# Patient Record
Sex: Male | Born: 1990 | Race: White | Hispanic: No | Marital: Single | Smoking: Current every day smoker
Health system: Southern US, Community
[De-identification: ages and names within clinical notes are randomized; demographics above are authoritative.]

## PROBLEM LIST (undated history)

## (undated) DIAGNOSIS — K219 Gastro-esophageal reflux disease without esophagitis: Secondary | ICD-10-CM

## (undated) DIAGNOSIS — J45909 Unspecified asthma, uncomplicated: Secondary | ICD-10-CM

## (undated) DIAGNOSIS — J189 Pneumonia, unspecified organism: Secondary | ICD-10-CM

## (undated) DIAGNOSIS — K3580 Unspecified acute appendicitis: Secondary | ICD-10-CM

## (undated) HISTORY — DX: Unspecified acute appendicitis: K35.80

## (undated) HISTORY — PX: OTHER SURGICAL HISTORY: SHX169

## (undated) HISTORY — PX: MANDIBLE FRACTURE SURGERY: SHX706

## (undated) HISTORY — PX: SHOULDER SURGERY: SHX246

## (undated) HISTORY — PX: APPENDECTOMY: SHX54

---
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
## 2011-06-22 LAB — CBC
HCT: 44.9
Hemoglobin: 15.5
MCHC: 34.5
MCV: 88.7
Platelets: 282
RBC: 5.06
RDW: 12.7
WBC: 13.4

## 2011-06-22 LAB — DIFFERENTIAL
Basophils Absolute: 0.1
Basophils Relative: 0
Eosinophils Absolute: 0.2
Eosinophils Relative: 1
Lymphocytes Relative: 18 — ABNORMAL LOW
Lymphs Abs: 2.4
Monocytes Absolute: 0.7
Monocytes Relative: 5
Neutro Abs: 10 — ABNORMAL HIGH
Neutrophils Relative %: 75 — ABNORMAL HIGH

## 2011-10-18 ENCOUNTER — Ambulatory Visit: Payer: Self-pay

## 2012-01-07 ENCOUNTER — Other Ambulatory Visit: Payer: Self-pay | Admitting: Internal Medicine

## 2012-01-07 DIAGNOSIS — R1013 Epigastric pain: Secondary | ICD-10-CM

## 2012-01-07 DIAGNOSIS — R140 Abdominal distension (gaseous): Secondary | ICD-10-CM

## 2012-01-08 ENCOUNTER — Ambulatory Visit
Admission: RE | Admit: 2012-01-08 | Discharge: 2012-01-08 | Disposition: A | Discharge status: Home / Self Care | Payer: BC Managed Care – PPO | Source: Ambulatory Visit | Attending: Internal Medicine | Admitting: Internal Medicine

## 2012-01-08 DIAGNOSIS — R140 Abdominal distension (gaseous): Secondary | ICD-10-CM

## 2012-01-08 DIAGNOSIS — R1013 Epigastric pain: Secondary | ICD-10-CM

## 2012-03-08 ENCOUNTER — Emergency Department (HOSPITAL_COMMUNITY)
Admission: EM | Admit: 2012-03-08 | Discharge: 2012-03-08 | Disposition: A | Discharge status: Home / Self Care | Payer: No Typology Code available for payment source | Attending: Emergency Medicine | Admitting: Emergency Medicine

## 2012-03-08 ENCOUNTER — Encounter (HOSPITAL_COMMUNITY): Payer: Self-pay | Admitting: *Deleted

## 2012-03-08 ENCOUNTER — Emergency Department (HOSPITAL_COMMUNITY): Payer: No Typology Code available for payment source

## 2012-03-08 DIAGNOSIS — R1032 Left lower quadrant pain: Secondary | ICD-10-CM | POA: Insufficient documentation

## 2012-03-08 DIAGNOSIS — S73109A Unspecified sprain of unspecified hip, initial encounter: Secondary | ICD-10-CM

## 2012-03-08 DIAGNOSIS — R000 Tachycardia, unspecified: Secondary | ICD-10-CM | POA: Insufficient documentation

## 2012-03-08 DIAGNOSIS — IMO0002 Reserved for concepts with insufficient information to code with codable children: Secondary | ICD-10-CM | POA: Insufficient documentation

## 2012-03-08 HISTORY — DX: Unspecified asthma, uncomplicated: J45.909

## 2012-03-08 MED ORDER — ONDANSETRON HCL 4 MG/2ML IJ SOLN
4.0000 mg | Freq: Once | INTRAMUSCULAR | Status: AC
  Administered 2012-03-08: 4 mg via INTRAVENOUS
  Filled 2012-03-08: qty 2

## 2012-03-08 MED ORDER — MORPHINE SULFATE 4 MG/ML IJ SOLN
4.0000 mg | Freq: Once | INTRAMUSCULAR | Status: AC
  Administered 2012-03-08: 4 mg via INTRAVENOUS
  Filled 2012-03-08: qty 1

## 2012-03-08 MED ORDER — IBUPROFEN 800 MG PO TABS: 800.0000 mg | ORAL_TABLET | Freq: Three times a day (TID) | ORAL | Status: AC

## 2012-03-08 MED ORDER — IOHEXOL 300 MG/ML  SOLN
100.0000 mL | Freq: Once | INTRAMUSCULAR | Status: AC | PRN
  Administered 2012-03-08: 100 mL via INTRAVENOUS

## 2012-03-08 MED ORDER — TETANUS-DIPHTH-ACELL PERTUSSIS 5-2.5-18.5 LF-MCG/0.5 IM SUSP
0.5000 mL | Freq: Once | INTRAMUSCULAR | Status: AC
  Administered 2012-03-08: 0.5 mL via INTRAMUSCULAR
  Filled 2012-03-08: qty 0.5

## 2012-03-08 MED ORDER — OXYCODONE-ACETAMINOPHEN 5-325 MG PO TABS: ORAL_TABLET | ORAL | Status: AC

## 2012-03-08 MED ORDER — SODIUM CHLORIDE 0.9 % IV SOLN
Freq: Once | INTRAVENOUS | Status: AC
  Administered 2012-03-08: 07:00:00 via INTRAVENOUS
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
## 2012-03-08 NOTE — Progress Notes (Signed)
Orthopedic Tech Progress Note Patient Details:  Jacob Huerta Jan 22, 1991 [id number]  Ortho Devices Type of Ortho Device: Crutches Ortho Device/Splint Interventions: Adjustment   Cammer, Mickie Bail 03/08/2012, 8:52 AM
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
## 2012-03-08 NOTE — ED Notes (Signed)
Patient transported to CT 
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
## 2012-03-09 NOTE — ED Provider Notes (Signed)
Medical screening examination/treatment/procedure(s) were conducted as a shared visit with non-physician practitioner(s) and myself.  I personally evaluated the patient during the encounter  Please see my separate respective documentation pertaining to this patient encounter   Vida Roller, MD 03/09/12 2206

## 2019-09-25 ENCOUNTER — Other Ambulatory Visit: Payer: Self-pay

## 2019-09-25 ENCOUNTER — Encounter: Payer: Self-pay | Admitting: Emergency Medicine

## 2019-09-25 DIAGNOSIS — J45909 Unspecified asthma, uncomplicated: Secondary | ICD-10-CM | POA: Diagnosis present

## 2019-09-25 DIAGNOSIS — Z885 Allergy status to narcotic agent status: Secondary | ICD-10-CM

## 2019-09-25 DIAGNOSIS — K3521 Acute appendicitis with generalized peritonitis, with abscess: Principal | ICD-10-CM | POA: Diagnosis present

## 2019-09-25 DIAGNOSIS — Z79899 Other long term (current) drug therapy: Secondary | ICD-10-CM

## 2019-09-25 DIAGNOSIS — Z20828 Contact with and (suspected) exposure to other viral communicable diseases: Secondary | ICD-10-CM | POA: Diagnosis present

## 2019-09-25 DIAGNOSIS — Z791 Long term (current) use of non-steroidal anti-inflammatories (NSAID): Secondary | ICD-10-CM

## 2019-09-25 DIAGNOSIS — F17200 Nicotine dependence, unspecified, uncomplicated: Secondary | ICD-10-CM | POA: Diagnosis present

## 2019-09-25 LAB — COMPREHENSIVE METABOLIC PANEL
ALT: 104 U/L — ABNORMAL HIGH (ref 0–44)
AST: 83 U/L — ABNORMAL HIGH (ref 15–41)
Albumin: 4.6 g/dL (ref 3.5–5.0)
Alkaline Phosphatase: 79 U/L (ref 38–126)
Anion gap: 11 (ref 5–15)
BUN: 9 mg/dL (ref 6–20)
CO2: 23 mmol/L (ref 22–32)
Calcium: 9.4 mg/dL (ref 8.9–10.3)
Chloride: 103 mmol/L (ref 98–111)
Creatinine, Ser: 0.76 mg/dL (ref 0.61–1.24)
GFR calc Af Amer: >60 mL/min (ref >60)
GFR calc non Af Amer: >60 mL/min (ref >60)
Glucose, Bld: 112 mg/dL — ABNORMAL HIGH (ref 70–99)
Potassium: 3.7 mmol/L (ref 3.5–5.1)
Sodium: 137 mmol/L (ref 135–145)
Total Bilirubin: 1.8 mg/dL — ABNORMAL HIGH (ref 0.3–1.2)
Total Protein: 8.3 g/dL — ABNORMAL HIGH (ref 6.5–8.1)

## 2019-09-25 LAB — URINALYSIS, COMPLETE (UACMP) WITH MICROSCOPIC
Bacteria, UA: NONE SEEN
Bilirubin Urine: NEGATIVE
Glucose, UA: NEGATIVE mg/dL
Hgb urine dipstick: NEGATIVE
Ketones, ur: NEGATIVE mg/dL
Leukocytes,Ua: NEGATIVE
Nitrite: NEGATIVE
Protein, ur: 30 mg/dL — AB
Specific Gravity, Urine: 1.023 (ref 1.005–1.030)
pH: 7 (ref 5.0–8.0)

## 2019-09-25 LAB — CBC
HCT: 46.4 % (ref 39.0–52.0)
Hemoglobin: 16.8 g/dL (ref 13.0–17.0)
MCH: 34.1 pg — ABNORMAL HIGH (ref 26.0–34.0)
MCHC: 36.2 g/dL — ABNORMAL HIGH (ref 30.0–36.0)
MCV: 94.1 fL (ref 80.0–100.0)
Platelets: 230 10*3/uL (ref 150–400)
RBC: 4.93 MIL/uL (ref 4.22–5.81)
RDW: 11.5 % (ref 11.5–15.5)
WBC: 12.6 10*3/uL — ABNORMAL HIGH (ref 4.0–10.5)
nRBC: 0 % (ref 0.0–0.2)

## 2019-09-25 LAB — LIPASE, BLOOD: Lipase: 20 U/L (ref 11–51)

## 2019-09-25 MED ORDER — SODIUM CHLORIDE 0.9% FLUSH: 3.0000 mL | Freq: Once | INTRAVENOUS | Status: DC

## 2019-09-25 NOTE — ED Notes (Signed)
Pt sitting in lobby with no distress noted; updated on wait time and vs retaken 

## 2019-09-25 NOTE — ED Triage Notes (Signed)
Right sided abdominal pain x 1 week.  Denies N/V/D.  States after eating feels bloated.  Last BM today.

## 2019-09-26 ENCOUNTER — Inpatient Hospital Stay
Admission: EM | Admit: 2019-09-26 | Discharge: 2019-09-28 | DRG: 340 | Disposition: A | Discharge status: Home / Self Care | Payer: Self-pay | Attending: Surgery | Admitting: Surgery

## 2019-09-26 ENCOUNTER — Emergency Department: Payer: Self-pay

## 2019-09-26 ENCOUNTER — Inpatient Hospital Stay: Payer: Self-pay

## 2019-09-26 DIAGNOSIS — R109 Unspecified abdominal pain: Secondary | ICD-10-CM

## 2019-09-26 DIAGNOSIS — K353 Acute appendicitis with localized peritonitis, without perforation or gangrene: Secondary | ICD-10-CM

## 2019-09-26 DIAGNOSIS — K3580 Unspecified acute appendicitis: Secondary | ICD-10-CM | POA: Diagnosis present

## 2019-09-26 LAB — BASIC METABOLIC PANEL
Anion gap: 11 (ref 5–15)
BUN: 13 mg/dL (ref 6–20)
CO2: 23 mmol/L (ref 22–32)
Calcium: 8.9 mg/dL (ref 8.9–10.3)
Chloride: 103 mmol/L (ref 98–111)
Creatinine, Ser: 0.88 mg/dL (ref 0.61–1.24)
GFR calc Af Amer: >60 mL/min (ref >60)
GFR calc non Af Amer: >60 mL/min (ref >60)
Glucose, Bld: 97 mg/dL (ref 70–99)
Potassium: 3.3 mmol/L — ABNORMAL LOW (ref 3.5–5.1)
Sodium: 137 mmol/L (ref 135–145)

## 2019-09-26 LAB — CBC WITH DIFFERENTIAL/PLATELET
Abs Immature Granulocytes: 0.05 10*3/uL (ref 0.00–0.07)
Basophils Absolute: 0.1 10*3/uL (ref 0.0–0.1)
Basophils Relative: 1 %
Eosinophils Absolute: 0.2 10*3/uL (ref 0.0–0.5)
Eosinophils Relative: 2 %
HCT: 42.1 % (ref 39.0–52.0)
Hemoglobin: 15.1 g/dL (ref 13.0–17.0)
Immature Granulocytes: 0 %
Lymphocytes Relative: 23 %
Lymphs Abs: 2.5 10*3/uL (ref 0.7–4.0)
MCH: 34.2 pg — ABNORMAL HIGH (ref 26.0–34.0)
MCHC: 35.9 g/dL (ref 30.0–36.0)
MCV: 95.2 fL (ref 80.0–100.0)
Monocytes Absolute: 0.9 10*3/uL (ref 0.1–1.0)
Monocytes Relative: 8 %
Neutro Abs: 7.4 10*3/uL (ref 1.7–7.7)
Neutrophils Relative %: 66 %
Platelets: 220 10*3/uL (ref 150–400)
RBC: 4.42 MIL/uL (ref 4.22–5.81)
RDW: 11.5 % (ref 11.5–15.5)
WBC: 11.1 10*3/uL — ABNORMAL HIGH (ref 4.0–10.5)
nRBC: 0 % (ref 0.0–0.2)

## 2019-09-26 LAB — RESPIRATORY PANEL BY RT PCR (FLU A&B, COVID)
Influenza A by PCR: NEGATIVE
Influenza B by PCR: NEGATIVE
SARS Coronavirus 2 by RT PCR: NEGATIVE

## 2019-09-26 LAB — HIV ANTIBODY (ROUTINE TESTING W REFLEX): HIV Screen 4th Generation wRfx: NONREACTIVE

## 2019-09-26 LAB — MAGNESIUM: Magnesium: 2 mg/dL (ref 1.7–2.4)

## 2019-09-26 MED ORDER — ACETAMINOPHEN 325 MG PO TABS
650.0000 mg | ORAL_TABLET | Freq: Four times a day (QID) | ORAL | Status: DC | PRN
  Administered 2019-09-26: 650 mg via ORAL
  Filled 2019-09-26: qty 2

## 2019-09-26 MED ORDER — KCL IN DEXTROSE-NACL 20-5-0.45 MEQ/L-%-% IV SOLN
INTRAVENOUS | Status: DC
  Filled 2019-09-26 (×8): qty 1000

## 2019-09-26 MED ORDER — ONDANSETRON HCL 4 MG/2ML IJ SOLN: 4.0000 mg | Freq: Four times a day (QID) | INTRAMUSCULAR | Status: DC | PRN

## 2019-09-26 MED ORDER — ONDANSETRON 4 MG PO TBDP: 4.0000 mg | ORAL_TABLET | Freq: Four times a day (QID) | ORAL | Status: DC | PRN

## 2019-09-26 MED ORDER — MORPHINE SULFATE (PF) 4 MG/ML IV SOLN
4.0000 mg | Freq: Once | INTRAVENOUS | Status: AC
  Administered 2019-09-26: 02:00:00 4 mg via INTRAVENOUS
  Filled 2019-09-26: qty 1

## 2019-09-26 MED ORDER — MORPHINE SULFATE (PF) 4 MG/ML IV SOLN
4.0000 mg | Freq: Once | INTRAVENOUS | Status: AC
  Administered 2019-09-26: 4 mg via INTRAVENOUS
  Filled 2019-09-26: qty 1

## 2019-09-26 MED ORDER — SODIUM CHLORIDE 0.9 % IV SOLN: 3.0000 g | Freq: Once | INTRAVENOUS | Status: DC

## 2019-09-26 MED ORDER — IOHEXOL 300 MG/ML  SOLN
100.0000 mL | Freq: Once | INTRAMUSCULAR | Status: AC | PRN
  Administered 2019-09-26: 100 mL via INTRAVENOUS

## 2019-09-26 MED ORDER — ACETAMINOPHEN 500 MG PO TABS: 1000.0000 mg | ORAL_TABLET | Freq: Four times a day (QID) | ORAL | Status: DC

## 2019-09-26 MED ORDER — LACTATED RINGERS IV SOLN
125.0000 mL/h | INTRAVENOUS | Status: DC
  Administered 2019-09-26 (×3): 125 mL/h via INTRAVENOUS

## 2019-09-26 MED ORDER — POLYETHYLENE GLYCOL 3350 17 G PO PACK: 17.0000 g | PACK | Freq: Every day | ORAL | Status: DC | PRN

## 2019-09-26 MED ORDER — HYDROMORPHONE HCL 1 MG/ML IJ SOLN
0.5000 mg | Freq: Once | INTRAMUSCULAR | Status: AC
  Administered 2019-09-26: 0.5 mg via INTRAVENOUS
  Filled 2019-09-26: qty 0.5

## 2019-09-26 MED ORDER — PANTOPRAZOLE SODIUM 40 MG IV SOLR
40.0000 mg | Freq: Every day | INTRAVENOUS | Status: DC
  Administered 2019-09-26 – 2019-09-27 (×2): 40 mg via INTRAVENOUS
  Filled 2019-09-26 (×2): qty 40

## 2019-09-26 MED ORDER — HYDROMORPHONE HCL 1 MG/ML IJ SOLN
0.5000 mg | INTRAMUSCULAR | Status: DC | PRN
  Administered 2019-09-26 – 2019-09-28 (×8): 0.5 mg via INTRAVENOUS
  Filled 2019-09-26 (×8): qty 0.5

## 2019-09-26 MED ORDER — SODIUM CHLORIDE 0.9 % IV SOLN
INTRAVENOUS | Status: DC | PRN
  Administered 2019-09-26: 500 mL via INTRAVENOUS

## 2019-09-26 MED ORDER — ALBUTEROL SULFATE (2.5 MG/3ML) 0.083% IN NEBU: 2.5000 mg | INHALATION_SOLUTION | Freq: Four times a day (QID) | RESPIRATORY_TRACT | Status: DC | PRN

## 2019-09-26 MED ORDER — ONDANSETRON HCL 4 MG/2ML IJ SOLN
4.0000 mg | Freq: Once | INTRAMUSCULAR | Status: AC
  Administered 2019-09-26: 4 mg via INTRAVENOUS
  Filled 2019-09-26: qty 2

## 2019-09-26 MED ORDER — KETOROLAC TROMETHAMINE 30 MG/ML IJ SOLN
30.0000 mg | Freq: Four times a day (QID) | INTRAMUSCULAR | Status: DC
  Administered 2019-09-26 (×3): 30 mg via INTRAVENOUS
  Filled 2019-09-26 (×3): qty 1

## 2019-09-26 MED ORDER — ENOXAPARIN SODIUM 40 MG/0.4ML ~~LOC~~ SOLN
40.0000 mg | SUBCUTANEOUS | Status: DC
  Administered 2019-09-26 – 2019-09-28 (×3): 40 mg via SUBCUTANEOUS
  Filled 2019-09-26 (×3): qty 0.4

## 2019-09-26 MED ORDER — PIPERACILLIN-TAZOBACTAM 3.375 G IVPB
3.3750 g | Freq: Three times a day (TID) | INTRAVENOUS | Status: DC
  Administered 2019-09-26 (×3): 3.375 g via INTRAVENOUS
  Filled 2019-09-26 (×3): qty 50

## 2019-09-26 NOTE — Progress Notes (Signed)
09/26/2019 1830  Called into patient's room because he was complaining of pain 10/10 "worse than when I came in here."  Administered scheduled toradol as ordered.  Contacted surgeon on call Lysle Pearl, who instructed to recheck vitals.  Dr. Lysle Pearl said he will be here to see the patient as soon as he can.  Vital signs showed elevated respiratory rate and blood pressure.  Patient describes slightly diminished pain, but generally feeling the same as when he first called.  Awaiting arrival of Dr. Lysle Pearl and further instructions.  Will relay this info to oncoming night shift RN  Dola Argyle, RN

## 2019-09-26 NOTE — ED Notes (Addendum)
ED TO INPATIENT HANDOFF REPORT  ED Nurse Name and Phone #:  Elijah Birkom RN   [phone]  S Name/Age/Gender Jacob Huerta 28 y.o. male Room/Bed: ED06A/ED06A  Code Status   Code Status: Full Code  Home/SNF/Other Home Patient oriented to: self, place, time and situation Is this baseline? Yes   Triage Complete: Triage complete  Chief Complaint Acute appendicitis [K35.80]  Triage Note Right sided abdominal pain x 1 week.  Denies N/V/D.  States after eating feels bloated.  Last BM today.    Allergies Allergies  Allergen Reactions  . Hydrocodone     Vomiting, itching    Level of Care/Admitting Diagnosis ED Disposition    ED Disposition Condition Comment   Admit  Hospital Area: [facility] [[id number]]  Level of Care: Med-Surg [16]  Covid Evaluation: Asymptomatic Screening Protocol (No Symptoms)  Diagnosis: Acute appendicitis [454098][[id number]]  Admitting Physician: Henrene DodgeISCOYA, JOSE [[id number]]  Attending Physician: Henrene DodgeISCOYA, JOSE [[id number]]  Estimated length of stay: past midnight tomorrow  Certification:: I certify this patient will need inpatient services for at least 2 midnights       B Medical/Surgery History Past Medical History:  Diagnosis Date  . Asthma    Past Surgical History:  Procedure Laterality Date  . OTHER SURGICAL HISTORY     jaw surgery, rotator cuff surgery     A IV Location/Drains/Wounds Patient Lines/Drains/Airways Status   Active Line/Drains/Airways    Name:   Placement date:   Placement time:   Site:   Days:   Peripheral IV 09/26/19 Left Antecubital   09/26/19    0217    Antecubital   less than 1          Intake/Output Last 24 hours No intake or output data in the 24 hours ending 09/26/19 0428  Labs/Imaging Results for orders placed or performed during the hospital encounter of 09/26/19 (from the past 48 hour(s))  Lipase, blood     Status: None   Collection Time: 09/25/19  6:11 PM  Result Value Ref Range   Lipase 20 11 - 51 U/L     Comment: Performed at [facility], [address] [place], [place] [address]  Comprehensive metabolic panel     Status: Abnormal   Collection Time: 09/25/19  6:11 PM  Result Value Ref Range   Sodium 137 135 - 145 mmol/L   Potassium 3.7 3.5 - 5.1 mmol/L   Chloride 103 98 - 111 mmol/L   CO2 23 22 - 32 mmol/L   Glucose, Bld 112 (H) 70 - 99 mg/dL   BUN 9 6 - 20 mg/dL   Creatinine, Ser 8.460.76 0.61 - 1.24 mg/dL   Calcium 9.4 8.9 - 96.210.3 mg/dL   Total Protein 8.3 (H) 6.5 - 8.1 g/dL   Albumin 4.6 3.5 - 5.0 g/dL   AST 83 (H) 15 - 41 U/L   ALT 104 (H) 0 - 44 U/L   Alkaline Phosphatase 79 38 - 126 U/L   Total Bilirubin 1.8 (H) 0.3 - 1.2 mg/dL   GFR calc non Af Amer >60 >60 mL/min   GFR calc Af Amer >60 >60 mL/min   Anion gap 11 5 - 15    Comment: Performed at [facility], [address] [place], [place] [address]  CBC     Status: Abnormal   Collection Time: 09/25/19  6:11 PM  Result Value Ref Range   WBC 12.6 (H) 4.0 - 10.5 K/uL   RBC 4.93 4.22 - 5.81 MIL/uL  Hemoglobin 16.8 13.0 - 17.0 g/dL   HCT 35.3 61.4 - 43.1 %   MCV 94.1 80.0 - 100.0 fL   MCH 34.1 (H) 26.0 - 34.0 pg   MCHC 36.2 (H) 30.0 - 36.0 g/dL   RDW 54.0 08.6 - 76.1 %   Platelets 230 150 - 400 K/uL   nRBC 0.0 0.0 - 0.2 %    Comment: Performed at [facility], [address] [place], [place] [address]  Urinalysis, Complete w Microscopic     Status: Abnormal   Collection Time: 09/25/19  6:11 PM  Result Value Ref Range   Color, Urine AMBER (A) YELLOW    Comment: BIOCHEMICALS MAY BE AFFECTED BY COLOR   APPearance CLEAR (A) CLEAR   Specific Gravity, Urine 1.023 1.005 - 1.030   pH 7.0 5.0 - 8.0   Glucose, UA NEGATIVE NEGATIVE mg/dL   Hgb urine dipstick NEGATIVE NEGATIVE   Bilirubin Urine NEGATIVE NEGATIVE   Ketones, ur NEGATIVE NEGATIVE mg/dL   Protein, ur 30 (A) NEGATIVE mg/dL   Nitrite NEGATIVE NEGATIVE   Leukocytes,Ua NEGATIVE NEGATIVE   RBC / HPF 0-5 0 - 5 RBC/hpf   WBC, UA 0-5 0  - 5 WBC/hpf   Bacteria, UA NONE SEEN NONE SEEN   Squamous Epithelial / LPF 0-5 0 - 5   Mucus PRESENT     Comment: Performed at [facility], [address] [place], [place] [address]   CT ABDOMEN PELVIS W CONTRAST  Result Date: 09/26/2019 CLINICAL DATA:  Right lower quadrant abdominal pain, appendicitis suspected. Patient reports right-sided abdominal pain for a week. EXAM: CT ABDOMEN AND PELVIS WITH CONTRAST TECHNIQUE: Multidetector CT imaging of the abdomen and pelvis was performed using the standard protocol following bolus administration of intravenous contrast. CONTRAST:  OMNIPAQUE IOHEXOL 300 MG/ML  SOLN COMPARISON:  CT 03/08/2012 FINDINGS: Lower chest: Lung bases are clear. Hepatobiliary: The liver is enlarged spanning 25.7 cm cranial caudal. Advanced hepatic steatosis with diffusely decreased hepatic density. No evidence of focal lesion. Gallbladder physiologically distended, no calcified stone. No biliary dilatation. Pancreas: No ductal dilatation or inflammation. Spleen: Enlarged spanning 16 cm cranial caudal. Lobular borders without focal abnormality. Small splenule at the hilum. Adrenals/Urinary Tract: Normal adrenal glands. No hydronephrosis or perinephric edema. Homogeneous renal enhancement. Small hypodense lesion in the central left kidney is too small to characterize but likely small cyst. Urinary bladder is physiologically distended without wall thickening. Stomach/Bowel: Acute appendicitis as described below. The stomach is nondistended. No small bowel dilatation or inflammation. Colonic diverticulosis is most prominent in the ascending and proximal colon. No evidence of acute diverticulitis. Appendix: Location: Retrocecal Diameter: 12 mm Appendicolith: No Mucosal hyper-enhancement: Minimal Extraluminal gas: No Periappendiceal collection: Significant periappendiceal fat stranding and small amount of free fluid but no organized fluid collection or abscess.  Vascular/Lymphatic: Prominent ileocolic nodes are likely reactive. Abdominal aorta is normal in caliber. The portal vein is patent. No portal venous or mesenteric gas. Reproductive: Prostate is unremarkable. Other: Right lower quadrant inflammation related to acute appendicitis. Small amount of free fluid with minimal fluid tracking into the right pericolic gutter. No organized fluid collection. No free air. Musculoskeletal: There are no acute or suspicious osseous abnormalities. Mild degenerative disc disease in the lower lumbar spine. IMPRESSION: 1. Uncomplicated acute appendicitis. 2. Hepatosplenomegaly and advanced hepatic steatosis. 3. Colonic diverticulosis primarily involving the ascending colon. No diverticulitis peer Electronically Signed   By: Narda Rutherford M.D.   On: 09/26/2019 03:22    Pending Labs  Unresulted Labs (From admission, onward)    Start     Ordered   09/26/19 7824  Basic metabolic panel  Tomorrow morning,   STAT     09/26/19 0349   09/26/19 0500  Magnesium  Tomorrow morning,   STAT     09/26/19 0349   09/26/19 0500  CBC WITH DIFFERENTIAL  Tomorrow morning,   STAT     09/26/19 0349   09/26/19 0347  HIV Antibody (routine testing w rflx)  (HIV Antibody (Routine testing w reflex) panel)  Once,   STAT     09/26/19 0349   09/26/19 0345  Respiratory Panel by RT PCR (Flu A&B, Covid) - Nasopharyngeal Swab  (Tier 2 Respiratory Panel by RT PCR (Flu A&B, Covid) (TAT 2 hrs))  Once,   STAT    Question Answer Comment  Is this test for diagnosis or screening Screening   Symptomatic for COVID-19 as defined by CDC No   Hospitalized for COVID-19 No   Admitted to ICU for COVID-19 No   Previously tested for COVID-19 No   Resident in a congregate (group) care setting No   Employed in healthcare setting No      09/26/19 0344          Vitals/Pain Today's Vitals   09/26/19 0300 09/26/19 0316 09/26/19 0330 09/26/19 0400  BP: (!) 169/96  (!) 132/51 (!) 143/81  Pulse: 100  92 (!) 102   Resp: 16  (!) 21 20  Temp:      TempSrc:      SpO2: 98%  94% 96%  Weight:      Height:      PainSc:  3       Isolation Precautions No active isolations  Medications Medications  lactated ringers infusion (125 mL/hr Intravenous New Bag/Given 09/26/19 0422)  acetaminophen (TYLENOL) tablet 1,000 mg (has no administration in time range)  ketorolac (TORADOL) 30 MG/ML injection 30 mg (has no administration in time range)  HYDROmorphone (DILAUDID) injection 0.5 mg (has no administration in time range)  polyethylene glycol (MIRALAX / GLYCOLAX) packet 17 g (has no administration in time range)  ondansetron (ZOFRAN-ODT) disintegrating tablet 4 mg (has no administration in time range)    Or  ondansetron (ZOFRAN) injection 4 mg (has no administration in time range)  pantoprazole (PROTONIX) injection 40 mg (has no administration in time range)  enoxaparin (LOVENOX) injection 40 mg (has no administration in time range)  piperacillin-tazobactam (ZOSYN) IVPB 3.375 g (has no administration in time range)  albuterol (PROVENTIL) (2.5 MG/3ML) 0.083% nebulizer solution 2.5 mg (has no administration in time range)  morphine 4 MG/ML injection 4 mg (4 mg Intravenous Given 09/26/19 0218)  ondansetron (ZOFRAN) injection 4 mg (4 mg Intravenous Given 09/26/19 0218)  iohexol (OMNIPAQUE) 300 MG/ML solution 100 mL (100 mLs Intravenous Contrast Given 09/26/19 0246)  morphine 4 MG/ML injection 4 mg (4 mg Intravenous Given 09/26/19 0354)    Mobility walks Low fall risk   Focused Assessments Cardiac Assessment Handoff:    No results found for: CKTOTAL, CKMB, CKMBINDEX, TROPONINI No results found for: DDIMER Does the Patient currently have chest pain? No      R Recommendations: See Admitting Provider Note  Report given to: Beth RN on 2C  Additional Notes:

## 2019-09-26 NOTE — Progress Notes (Addendum)
Subjective:  CC: Jacob Huerta is a 28 y.o. male  Hospital stay day 0,   acute appendicitis  HPI: Called by RN re: increased pain. RLQ toward right flank.  Sudden onset, no instigating factor, minimal change after administration of toradol.  Last dose of dilaudid was a few hours prior to pain onset.  Presentation similar to pain on admission.  No nausea/vomiting.    ROS:  General: Denies weight loss, weight gain, fatigue, fevers, chills, and night sweats. Heart: Denies chest pain, palpitations, racing heart, irregular heartbeat, leg pain or swelling, and decreased activity tolerance. Respiratory: Denies breathing difficulty, shortness of breath, wheezing, cough, and sputum. GI: Denies change in appetite, heartburn, nausea, vomiting, constipation, diarrhea, and blood in stool. GU: Denies difficulty urinating, pain with urinating, urgency, frequency, blood in urine.   Objective:   Temp:  [98.2 F (36.8 C)-102.9 F (39.4 C)] 102.9 F (39.4 C) (12/29 2100) Pulse Rate:  [87-105] 104 (12/29 2100) Resp:  [16-26] 20 (12/29 2100) BP: (132-189)/(51-102) 145/96 (12/29 2100) SpO2:  [93 %-100 %] 99 % (12/29 2100)     Height: 5\' 8"  (172.7 cm) Weight: 106.6 kg BMI (Calculated): 35.74   Intake/Output this shift:   Intake/Output Summary (Last 24 hours) at 09/26/2019 2101 Last data filed at 09/26/2019 1700 Gross per 24 hour  Intake 1776.9 ml  Output 351 ml  Net 1425.9 ml    Constitutional :  alert, cooperative, appears stated age and no distress  Respiratory:  clear to auscultation bilaterally  Cardiovascular:  regular rate and rhythm  Gastrointestinal: soft, no guarding, but TTP in RLQ and right flank as reported. remaining quadrants with no pain..   Skin: Cool and moist.   Psychiatric: Normal affect, non-agitated, not confused       LABS:  CMP Latest Ref Rng & Units 09/26/2019 09/25/2019  Glucose 70 - 99 mg/dL 97 112(H)  BUN 6 - 20 mg/dL 13 9  Creatinine 0.61 - 1.24 mg/dL 0.88 0.76   Sodium 135 - 145 mmol/L 137 137  Potassium 3.5 - 5.1 mmol/L 3.3(L) 3.7  Chloride 98 - 111 mmol/L 103 103  CO2 22 - 32 mmol/L 23 23  Calcium 8.9 - 10.3 mg/dL 8.9 9.4  Total Protein 6.5 - 8.1 g/dL - 8.3(H)  Total Bilirubin 0.3 - 1.2 mg/dL - 1.8(H)  Alkaline Phos 38 - 126 U/L - 79  AST 15 - 41 U/L - 83(H)  ALT 0 - 44 U/L - 104(H)   CBC Latest Ref Rng & Units 09/26/2019 09/25/2019 11/29/2007  WBC 4.0 - 10.5 K/uL 11.1(H) 12.6(H) 13.4  Hemoglobin 13.0 - 17.0 g/dL 15.1 16.8 15.5  Hematocrit 39.0 - 52.0 % 42.1 46.4 44.9  Platelets 150 - 400 K/uL 220 230 282    RADS: CLINICAL DATA:  Appendicitis.  EXAM: ABDOMEN - 1 VIEW  COMPARISON:  September 26, 2019  FINDINGS: The bowel gas pattern is nonspecific and nonobstructive. There are no radiopaque kidney stones. There is no obvious acute osseous abnormality. No pneumatosis or free air.  IMPRESSION: Nonobstructive bowel gas pattern. No obvious pneumatosis or free air.   Electronically Signed   By: Constance Holster M.D.   On: 09/26/2019 20:44  Assessment:   Appendicitis, on iv abx.  NPO.  Wbc decreased this am and doing well throughout the day until late evening with sudden worsening of pain.  Initial exam with no obvious peritonitis.  Asked to give another dose of dilaudid after exam and KUB ordered with resulted noted above.  Re-exam  after dilaudid administration notes decreased pain and less TTP on exam.  However, he did develop fever of 102.9 on repeat vitals.  Discussed with patient how appendicitis usually improves with IV abx, overall clinical picture concerning for possible failure of abx management.  I offered him to proceed with operative intervention at this time, explaining to him he is still at high risk for possible conversion to open, possible ileocecotmy.  Pt stated he would prefer to wait a few more hours to see if any recurrence of pain after dilaudid starts wearing off.  Since no sign of obvious peritonitis on  exam and negative upright KUB for free air, I stated we can continue with serial abdominal exams for now.  Low threshold for OR.

## 2019-09-26 NOTE — H&P (Signed)
Date of Admission:  09/26/2019  Reason for Admission:  Acute appendicitis  History of Present Illness: Jacob Huerta is a 28 y.o. male presenting with a 1 week history of right lower quadrant abdominal pain.  He had a back sprain about that time as well and he thought the right sided pain was related to the back.  However, the pain persisted and got significantly worse on 12/27 and 12/28 and he presented yesterday to the ED.  He denies any fevers or chills, chest pain, shortness of breath, nausea, or vomiting.  His workup in the ED shows a WBC of 12.6.  CT scan was obtained showing acute appendicitis, with a retrocecal appendix, and significant periappendiceal inflammation and some mild free fluid.  Past Medical History: Past Medical History:  Diagnosis Date  . Asthma      Past Surgical History: Past Surgical History:  Procedure Laterality Date  . OTHER SURGICAL HISTORY     jaw surgery, rotator cuff surgery    Home Medications: Prior to Admission medications   Medication Sig Start Date End Date Taking? Authorizing Provider  albuterol (PROVENTIL HFA;VENTOLIN HFA) 108 (90 BASE) MCG/ACT inhaler Inhale 2 puffs into the lungs every 6 (six) hours as needed. For breathing   Yes [provider]  ibuprofen (ADVIL) 200 MG tablet Take 200 mg by mouth every 6 (six) hours as needed for fever, headache or moderate pain.   Yes [provider]    Allergies: Allergies  Allergen Reactions  . Hydrocodone     Vomiting, itching    Social History:  reports that he has been smoking. He has never used smokeless tobacco. He reports current alcohol use. He reports that he does not use drugs.   Family History: No family history on file.  Review of Systems: Review of Systems  Constitutional: Negative for chills and fever.  HENT: Negative for hearing loss.   Respiratory: Negative for shortness of breath.   Cardiovascular: Negative for chest pain.  Gastrointestinal: Positive  for abdominal pain. Negative for constipation, diarrhea, nausea and vomiting.  Genitourinary: Negative for dysuria.  Musculoskeletal: Positive for joint pain. Negative for myalgias.  Skin: Negative for rash.  Neurological: Negative for dizziness.  Psychiatric/Behavioral: Negative for depression.    Physical Exam BP (!) 138/94 (BP Location: Right Arm)   Pulse 92   Temp 98.2 F (36.8 C) (Oral)   Resp 16   Ht 5\' 8"  (1.727 m)   Wt 106.6 kg   SpO2 97%   BMI 35.73 kg/m  CONSTITUTIONAL: No acute distress HEENT:  Normocephalic, atraumatic, extraocular motion intact. NECK: Trachea is midline, and there is no jugular venous distension.  RESPIRATORY:  Lungs are clear, and breath sounds are equal bilaterally. Normal respiratory effort without pathologic use of accessory muscles. CARDIOVASCULAR: Heart is regular without murmurs, gallops, or rubs. GI: The abdomen is soft, obese, non-distended, with tenderness to palpation in the right lower quadrant and flank area.  Non-peritoneal.  MUSCULOSKELETAL:  Normal muscle strength and tone in all four extremities.  No peripheral edema or cyanosis. SKIN: Skin turgor is normal. There are no pathologic skin lesions.  NEUROLOGIC:  Motor and sensation is grossly normal.  Cranial nerves are grossly intact. PSYCH:  Alert and oriented to person, place and time. Affect is normal.  Laboratory Analysis: Results for orders placed or performed during the hospital encounter of 09/26/19 (from the past 24 hour(s))  Lipase, blood     Status: None   Collection Time: 09/25/19  6:11 PM  Result Value Ref Range   Lipase 20 11 - 51 U/L  Comprehensive metabolic panel     Status: Abnormal   Collection Time: 09/25/19  6:11 PM  Result Value Ref Range   Sodium 137 135 - 145 mmol/L   Potassium 3.7 3.5 - 5.1 mmol/L   Chloride 103 98 - 111 mmol/L   CO2 23 22 - 32 mmol/L   Glucose, Bld 112 (H) 70 - 99 mg/dL   BUN 9 6 - 20 mg/dL   Creatinine, Ser 0.76 0.61 - 1.24 mg/dL    Calcium 9.4 8.9 - 10.3 mg/dL   Total Protein 8.3 (H) 6.5 - 8.1 g/dL   Albumin 4.6 3.5 - 5.0 g/dL   AST 83 (H) 15 - 41 U/L   ALT 104 (H) 0 - 44 U/L   Alkaline Phosphatase 79 38 - 126 U/L   Total Bilirubin 1.8 (H) 0.3 - 1.2 mg/dL   GFR calc non Af Amer >60 >60 mL/min   GFR calc Af Amer >60 >60 mL/min   Anion gap 11 5 - 15  CBC     Status: Abnormal   Collection Time: 09/25/19  6:11 PM  Result Value Ref Range   WBC 12.6 (H) 4.0 - 10.5 K/uL   RBC 4.93 4.22 - 5.81 MIL/uL   Hemoglobin 16.8 13.0 - 17.0 g/dL   HCT 46.4 39.0 - 52.0 %   MCV 94.1 80.0 - 100.0 fL   MCH 34.1 (H) 26.0 - 34.0 pg   MCHC 36.2 (H) 30.0 - 36.0 g/dL   RDW 11.5 11.5 - 15.5 %   Platelets 230 150 - 400 K/uL   nRBC 0.0 0.0 - 0.2 %  Urinalysis, Complete w Microscopic     Status: Abnormal   Collection Time: 09/25/19  6:11 PM  Result Value Ref Range   Color, Urine AMBER (A) YELLOW   APPearance CLEAR (A) CLEAR   Specific Gravity, Urine 1.023 1.005 - 1.030   pH 7.0 5.0 - 8.0   Glucose, UA NEGATIVE NEGATIVE mg/dL   Hgb urine dipstick NEGATIVE NEGATIVE   Bilirubin Urine NEGATIVE NEGATIVE   Ketones, ur NEGATIVE NEGATIVE mg/dL   Protein, ur 30 (A) NEGATIVE mg/dL   Nitrite NEGATIVE NEGATIVE   Leukocytes,Ua NEGATIVE NEGATIVE   RBC / HPF 0-5 0 - 5 RBC/hpf   WBC, UA 0-5 0 - 5 WBC/hpf   Bacteria, UA NONE SEEN NONE SEEN   Squamous Epithelial / LPF 0-5 0 - 5   Mucus PRESENT   Respiratory Panel by RT PCR (Flu A&B, Covid) - Nasopharyngeal Swab     Status: None   Collection Time: 09/26/19  4:33 AM   Specimen: Nasopharyngeal Swab  Result Value Ref Range   SARS Coronavirus 2 by RT PCR NEGATIVE NEGATIVE   Influenza A by PCR NEGATIVE NEGATIVE   Influenza B by PCR NEGATIVE NEGATIVE  Basic metabolic panel     Status: Abnormal   Collection Time: 09/26/19  6:24 AM  Result Value Ref Range   Sodium 137 135 - 145 mmol/L   Potassium 3.3 (L) 3.5 - 5.1 mmol/L   Chloride 103 98 - 111 mmol/L   CO2 23 22 - 32 mmol/L   Glucose, Bld 97 70  - 99 mg/dL   BUN 13 6 - 20 mg/dL   Creatinine, Ser 0.88 0.61 - 1.24 mg/dL   Calcium 8.9 8.9 - 10.3 mg/dL   GFR calc non Af Amer >60 >60 mL/min   GFR calc Af Amer >60 >60  mL/min   Anion gap 11 5 - 15  Magnesium     Status: None   Collection Time: 09/26/19  6:24 AM  Result Value Ref Range   Magnesium 2.0 1.7 - 2.4 mg/dL  CBC WITH DIFFERENTIAL     Status: Abnormal   Collection Time: 09/26/19  6:24 AM  Result Value Ref Range   WBC 11.1 (H) 4.0 - 10.5 K/uL   RBC 4.42 4.22 - 5.81 MIL/uL   Hemoglobin 15.1 13.0 - 17.0 g/dL   HCT 82.942.1 56.239.0 - 13.052.0 %   MCV 95.2 80.0 - 100.0 fL   MCH 34.2 (H) 26.0 - 34.0 pg   MCHC 35.9 30.0 - 36.0 g/dL   RDW 86.511.5 78.411.5 - 69.615.5 %   Platelets 220 150 - 400 K/uL   nRBC 0.0 0.0 - 0.2 %   Neutrophils Relative % 66 %   Neutro Abs 7.4 1.7 - 7.7 K/uL   Lymphocytes Relative 23 %   Lymphs Abs 2.5 0.7 - 4.0 K/uL   Monocytes Relative 8 %   Monocytes Absolute 0.9 0.1 - 1.0 K/uL   Eosinophils Relative 2 %   Eosinophils Absolute 0.2 0.0 - 0.5 K/uL   Basophils Relative 1 %   Basophils Absolute 0.1 0.0 - 0.1 K/uL   Immature Granulocytes 0 %   Abs Immature Granulocytes 0.05 0.00 - 0.07 K/uL    Imaging: CT ABDOMEN PELVIS W CONTRAST  Result Date: 09/26/2019 CLINICAL DATA:  Right lower quadrant abdominal pain, appendicitis suspected. Patient reports right-sided abdominal pain for a week. EXAM: CT ABDOMEN AND PELVIS WITH CONTRAST TECHNIQUE: Multidetector CT imaging of the abdomen and pelvis was performed using the standard protocol following bolus administration of intravenous contrast. CONTRAST:  100mL OMNIPAQUE IOHEXOL 300 MG/ML  SOLN COMPARISON:  CT 03/08/2012 FINDINGS: Lower chest: Lung bases are clear. Hepatobiliary: The liver is enlarged spanning 25.7 cm cranial caudal. Advanced hepatic steatosis with diffusely decreased hepatic density. No evidence of focal lesion. Gallbladder physiologically distended, no calcified stone. No biliary dilatation. Pancreas: No ductal  dilatation or inflammation. Spleen: Enlarged spanning 16 cm cranial caudal. Lobular borders without focal abnormality. Small splenule at the hilum. Adrenals/Urinary Tract: Normal adrenal glands. No hydronephrosis or perinephric edema. Homogeneous renal enhancement. Small hypodense lesion in the central left kidney is too small to characterize but likely small cyst. Urinary bladder is physiologically distended without wall thickening. Stomach/Bowel: Acute appendicitis as described below. The stomach is nondistended. No small bowel dilatation or inflammation. Colonic diverticulosis is most prominent in the ascending and proximal colon. No evidence of acute diverticulitis. Appendix: Location: Retrocecal Diameter: 12 mm Appendicolith: No Mucosal hyper-enhancement: Minimal Extraluminal gas: No Periappendiceal collection: Significant periappendiceal fat stranding and small amount of free fluid but no organized fluid collection or abscess. Vascular/Lymphatic: Prominent ileocolic nodes are likely reactive. Abdominal aorta is normal in caliber. The portal vein is patent. No portal venous or mesenteric gas. Reproductive: Prostate is unremarkable. Other: Right lower quadrant inflammation related to acute appendicitis. Small amount of free fluid with minimal fluid tracking into the right pericolic gutter. No organized fluid collection. No free air. Musculoskeletal: There are no acute or suspicious osseous abnormalities. Mild degenerative disc disease in the lower lumbar spine. IMPRESSION: 1. Uncomplicated acute appendicitis. 2. Hepatosplenomegaly and advanced hepatic steatosis. 3. Colonic diverticulosis primarily involving the ascending colon. No diverticulitis peer Electronically Signed   By: Narda RutherfordMelanie  Sanford M.D.   On: 09/26/2019 03:22    Assessment and Plan: This is a 28 y.o. male with acute appendicitis.  Though the report from radiology says it's not perforated, I do think that he has perforated based on the clinical  history and duration, as well as the significant inflammatory response around the appendix.  Discussed with him that I would want to try him on conservative management with NPO diet, IV antibiotics, and appropriate pain control, to hopefully calm down the infection and inflammation, with plans for interval appendectomy in about 8 weeks.  Discussed that if there is no improvement, or if there is worsening, we may repeat CT scan to evaluate for any abscess or if he may eventually need surgery despite of medical management.  He is aware that if he does need more urgent surgery, more tissue may need to be resected rather than just the appendix alone based on the amount of inflammation on the CT.  Patient understands this plan and all of his questions have been answered.   [name], MD [facility] Pg:  [phone]

## 2019-09-26 NOTE — Progress Notes (Signed)
Called Dr. Lysle Pearl regarding patient's temp- 102.9 and increased heart rate of 119. Temperature was taken an hour after tylenol was given for fever.  Will recheck in an half hour.  Christene Slates  09/26/2019  11:09 PM

## 2019-09-26 NOTE — ED Provider Notes (Signed)
[facility] Emergency Department Provider Note  ____________________________________________   First MD Initiated Contact with Patient 09/26/19 [phone]     (approximate)  I have reviewed the triage vital signs and the nursing notes.   HISTORY  Chief Complaint Abdominal Pain    HPI Jacob Huerta is a 28 y.o. male presents to the emergency department secondary to a 1 week history of right sided abdominal discomfort which patient states is 10 at maximum intensity but occasionally decreases to a 7.  Patient denies any nausea no vomiting.  Patient denies any diarrhea.  Patient denies any dysuria or hematuria.  Patient does state that he has noted that his urine has been very dark".  Patient denies any fever.        Past Medical History:  Diagnosis Date  . Asthma     Patient Active Problem List   Diagnosis Date Noted  . Acute appendicitis 09/26/2019    Past Surgical History:  Procedure Laterality Date  . OTHER SURGICAL HISTORY     jaw surgery, rotator cuff surgery    Prior to Admission medications   Medication Sig Start Date End Date Taking? Authorizing Provider  albuterol (PROVENTIL HFA;VENTOLIN HFA) 108 (90 BASE) MCG/ACT inhaler Inhale 2 puffs into the lungs every 6 (six) hours as needed. For breathing   Yes [provider]  ibuprofen (ADVIL) 200 MG tablet Take 200 mg by mouth every 6 (six) hours as needed for fever, headache or moderate pain.   Yes [provider]    Allergies Hydrocodone  No family history on file.  Social History Social History   Tobacco Use  . Smoking status: Current Every Day Smoker  . Smokeless tobacco: Never Used  Substance Use Topics  . Alcohol use: Yes    Comment: occ  . Drug use: No    Review of Systems Constitutional: No fever/chills Eyes: No visual changes. ENT: No sore throat. Cardiovascular: Denies chest pain. Respiratory: Denies shortness of breath. Gastrointestinal: Positive  for abdominal pain.  No nausea, no vomiting.  No diarrhea.  No constipation. Genitourinary: Negative for dysuria. Musculoskeletal: Negative for neck pain.  Negative for back pain. Integumentary: Negative for rash. Neurological: Negative for headaches, focal weakness or numbness.  ____________________________________________   PHYSICAL EXAM:  VITAL SIGNS: ED Triage Vitals  Enc Vitals Group     BP 09/25/19 1802 (!) 159/102     Pulse Rate 09/25/19 1802 (!) 102     Resp 09/25/19 1802 18     Temp 09/25/19 1802 98.4 F (36.9 C)     Temp Source 09/25/19 1802 Oral     SpO2 09/25/19 1802 97 %     Weight 09/25/19 1802 106.6 kg (235 lb)     Height 09/25/19 1802 1.727 m (5\' 8" )     Head Circumference --      Peak Flow --      Pain Score 09/25/19 1809 8     Pain Loc --      Pain Edu? --      Excl. in GC? --     Constitutional: Alert and oriented.  Eyes: Conjunctivae are normal.  Mouth/Throat: Patient is wearing a mask. Neck: No stridor.  No meningeal signs.   Cardiovascular: Normal rate, regular rhythm. Good peripheral circulation. Grossly normal heart sounds. Respiratory: Normal respiratory effort.  No retractions. Gastrointestinal: Right lower quadrant tenderness to palpation.. No distention.  Musculoskeletal: No lower extremity tenderness nor edema. No gross deformities of extremities. Neurologic:  Normal  speech and language. No gross focal neurologic deficits are appreciated.  Skin:  Skin is warm, dry and intact. Psychiatric: Mood and affect are normal. Speech and behavior are normal.  ____________________________________________   LABS (all labs ordered are listed, but only abnormal results are displayed)  Labs Reviewed  COMPREHENSIVE METABOLIC PANEL - Abnormal; Notable for the following components:      Result Value   Glucose, Bld 112 (*)    Total Protein 8.3 (*)    AST 83 (*)    ALT 104 (*)    Total Bilirubin 1.8 (*)    All other components within normal limits  CBC  - Abnormal; Notable for the following components:   WBC 12.6 (*)    MCH 34.1 (*)    MCHC 36.2 (*)    All other components within normal limits  URINALYSIS, COMPLETE (UACMP) WITH MICROSCOPIC - Abnormal; Notable for the following components:   Color, Urine AMBER (*)    APPearance CLEAR (*)    Protein, ur 30 (*)    All other components within normal limits  RESPIRATORY PANEL BY RT PCR (FLU A&B, COVID)  LIPASE, BLOOD  HIV ANTIBODY (ROUTINE TESTING W REFLEX)  BASIC METABOLIC PANEL  MAGNESIUM  CBC WITH DIFFERENTIAL/PLATELET    RADIOLOGY I, [place] Leesha Veno, personally viewed and evaluated these images (plain radiographs) as part of my medical decision making, as well as reviewing the written report by the radiologist.  ED MD interpretation: "uncomplicated acute appendicitis" per radiologist on CT abdomen pelvis interpretation.  Official radiology report(s): CT ABDOMEN PELVIS W CONTRAST  Result Date: 09/26/2019 CLINICAL DATA:  Right lower quadrant abdominal pain, appendicitis suspected. Patient reports right-sided abdominal pain for a week. EXAM: CT ABDOMEN AND PELVIS WITH CONTRAST TECHNIQUE: Multidetector CT imaging of the abdomen and pelvis was performed using the standard protocol following bolus administration of intravenous contrast. CONTRAST:  174mL OMNIPAQUE IOHEXOL 300 MG/ML  SOLN COMPARISON:  CT 03/08/2012 FINDINGS: Lower chest: Lung bases are clear. Hepatobiliary: The liver is enlarged spanning 25.7 cm cranial caudal. Advanced hepatic steatosis with diffusely decreased hepatic density. No evidence of focal lesion. Gallbladder physiologically distended, no calcified stone. No biliary dilatation. Pancreas: No ductal dilatation or inflammation. Spleen: Enlarged spanning 16 cm cranial caudal. Lobular borders without focal abnormality. Small splenule at the hilum. Adrenals/Urinary Tract: Normal adrenal glands. No hydronephrosis or perinephric edema. Homogeneous renal enhancement. Small  hypodense lesion in the central left kidney is too small to characterize but likely small cyst. Urinary bladder is physiologically distended without wall thickening. Stomach/Bowel: Acute appendicitis as described below. The stomach is nondistended. No small bowel dilatation or inflammation. Colonic diverticulosis is most prominent in the ascending and proximal colon. No evidence of acute diverticulitis. Appendix: Location: Retrocecal Diameter: 12 mm Appendicolith: No Mucosal hyper-enhancement: Minimal Extraluminal gas: No Periappendiceal collection: Significant periappendiceal fat stranding and small amount of free fluid but no organized fluid collection or abscess. Vascular/Lymphatic: Prominent ileocolic nodes are likely reactive. Abdominal aorta is normal in caliber. The portal vein is patent. No portal venous or mesenteric gas. Reproductive: Prostate is unremarkable. Other: Right lower quadrant inflammation related to acute appendicitis. Small amount of free fluid with minimal fluid tracking into the right pericolic gutter. No organized fluid collection. No free air. Musculoskeletal: There are no acute or suspicious osseous abnormalities. Mild degenerative disc disease in the lower lumbar spine. IMPRESSION: 1. Uncomplicated acute appendicitis. 2. Hepatosplenomegaly and advanced hepatic steatosis. 3. Colonic diverticulosis primarily involving the ascending colon. No diverticulitis peer Electronically Signed  By: Narda RutherfordMelanie  Sanford M.D.   On: 09/26/2019 03:22      Procedures   ____________________________________________   INITIAL IMPRESSION / MDM / ASSESSMENT AND PLAN / ED COURSE  As part of my medical decision making, I reviewed the following data within the electronic MEDICAL RECORD NUMBER   28 year old male presenting with history and physical exam concerning for acute appendicitis which was confirmed on CT. Patient given IV morphine x2 doses with improvement of pain. Patient also given Unasyn 3 g IV.  Patient discussed with Dr. Aleen CampiPiscoya general surgeon on-call who admitted the patient for further management.      ____________________________________________  FINAL CLINICAL IMPRESSION(S) / ED DIAGNOSES  Final diagnoses:  Acute appendicitis with localized peritonitis, without perforation, abscess, or gangrene     MEDICATIONS GIVEN DURING THIS VISIT:  Medications  lactated ringers infusion (has no administration in time range)  acetaminophen (TYLENOL) tablet 1,000 mg (has no administration in time range)  ketorolac (TORADOL) 30 MG/ML injection 30 mg (has no administration in time range)  HYDROmorphone (DILAUDID) injection 0.5 mg (has no administration in time range)  polyethylene glycol (MIRALAX / GLYCOLAX) packet 17 g (has no administration in time range)  ondansetron (ZOFRAN-ODT) disintegrating tablet 4 mg (has no administration in time range)    Or  ondansetron (ZOFRAN) injection 4 mg (has no administration in time range)  pantoprazole (PROTONIX) injection 40 mg (has no administration in time range)  enoxaparin (LOVENOX) injection 40 mg (has no administration in time range)  piperacillin-tazobactam (ZOSYN) IVPB 3.375 g (has no administration in time range)  albuterol (PROVENTIL) (2.5 MG/3ML) 0.083% nebulizer solution 2.5 mg (has no administration in time range)  morphine 4 MG/ML injection 4 mg (4 mg Intravenous Given 09/26/19 0218)  ondansetron (ZOFRAN) injection 4 mg (4 mg Intravenous Given 09/26/19 0218)  iohexol (OMNIPAQUE) 300 MG/ML solution 100 mL (100 mLs Intravenous Contrast Given 09/26/19 0246)  morphine 4 MG/ML injection 4 mg (4 mg Intravenous Given 09/26/19 0354)     ED Discharge Orders    None      *Please note:  Jacob Huerta was evaluated in Emergency Department on 09/26/2019 for the symptoms described in the history of present illness. He was evaluated in the context of the global COVID-19 pandemic, which necessitated consideration that the patient might be  at risk for infection with the SARS-CoV-2 virus that causes COVID-19. Institutional protocols and algorithms that pertain to the evaluation of patients at risk for COVID-19 are in a state of rapid change based on information released by regulatory bodies including the CDC and federal and state organizations. These policies and algorithms were followed during the patient's care in the ED.  Some ED evaluations and interventions may be delayed as a result of limited staffing during the pandemic.*  Note:  This document was prepared using Dragon voice recognition software and may include unintentional dictation errors.   Darci CurrentBrown, [place] N, MD 09/26/19 [phone]

## 2019-09-26 NOTE — Progress Notes (Signed)
Rechecked temperature and it increased to 103. Dr. Lysle Pearl is coming to see patient.  Jacob Huerta

## 2019-09-27 ENCOUNTER — Encounter: Payer: Self-pay | Admitting: Surgery

## 2019-09-27 ENCOUNTER — Inpatient Hospital Stay: Payer: Self-pay | Admitting: Anesthesiology

## 2019-09-27 ENCOUNTER — Encounter
Admission: EM | Disposition: A | Discharge status: Home / Self Care | Payer: Self-pay | Source: Home / Self Care | Attending: Surgery

## 2019-09-27 HISTORY — PX: LAPAROSCOPIC APPENDECTOMY: SHX408

## 2019-09-27 LAB — CBC
HCT: 40.6 % (ref 39.0–52.0)
Hemoglobin: 14.9 g/dL (ref 13.0–17.0)
MCH: 34.1 pg — ABNORMAL HIGH (ref 26.0–34.0)
MCHC: 36.7 g/dL — ABNORMAL HIGH (ref 30.0–36.0)
MCV: 92.9 fL (ref 80.0–100.0)
Platelets: 204 10*3/uL (ref 150–400)
RBC: 4.37 MIL/uL (ref 4.22–5.81)
RDW: 11.3 % — ABNORMAL LOW (ref 11.5–15.5)
WBC: 11.9 10*3/uL — ABNORMAL HIGH (ref 4.0–10.5)
nRBC: 0 % (ref 0.0–0.2)

## 2019-09-27 LAB — BASIC METABOLIC PANEL
Anion gap: 11 (ref 5–15)
BUN: 16 mg/dL (ref 6–20)
CO2: 21 mmol/L — ABNORMAL LOW (ref 22–32)
Calcium: 8.3 mg/dL — ABNORMAL LOW (ref 8.9–10.3)
Chloride: 104 mmol/L (ref 98–111)
Creatinine, Ser: 1.12 mg/dL (ref 0.61–1.24)
GFR calc Af Amer: >60 mL/min (ref >60)
GFR calc non Af Amer: >60 mL/min (ref >60)
Glucose, Bld: 191 mg/dL — ABNORMAL HIGH (ref 70–99)
Potassium: 3.4 mmol/L — ABNORMAL LOW (ref 3.5–5.1)
Sodium: 136 mmol/L (ref 135–145)

## 2019-09-27 SURGERY — APPENDECTOMY, LAPAROSCOPIC
Anesthesia: General

## 2019-09-27 MED ORDER — LACTATED RINGERS IV SOLN: INTRAVENOUS | Status: DC | PRN

## 2019-09-27 MED ORDER — PROPOFOL 10 MG/ML IV BOLUS
INTRAVENOUS | Status: AC
  Filled 2019-09-27: qty 20

## 2019-09-27 MED ORDER — SUGAMMADEX SODIUM 200 MG/2ML IV SOLN
INTRAVENOUS | Status: DC | PRN
  Administered 2019-09-27: 220 mg via INTRAVENOUS

## 2019-09-27 MED ORDER — PROPOFOL 10 MG/ML IV BOLUS
INTRAVENOUS | Status: DC | PRN
  Administered 2019-09-27: 200 mg via INTRAVENOUS

## 2019-09-27 MED ORDER — IPRATROPIUM-ALBUTEROL 0.5-2.5 (3) MG/3ML IN SOLN
RESPIRATORY_TRACT | Status: AC
  Filled 2019-09-27: qty 3

## 2019-09-27 MED ORDER — SUGAMMADEX SODIUM 500 MG/5ML IV SOLN
INTRAVENOUS | Status: AC
  Filled 2019-09-27: qty 5

## 2019-09-27 MED ORDER — DEXMEDETOMIDINE HCL IN NACL 80 MCG/20ML IV SOLN
INTRAVENOUS | Status: AC
  Filled 2019-09-27: qty 20

## 2019-09-27 MED ORDER — ROCURONIUM BROMIDE 50 MG/5ML IV SOLN
INTRAVENOUS | Status: AC
  Filled 2019-09-27: qty 1

## 2019-09-27 MED ORDER — ONDANSETRON HCL 4 MG/2ML IJ SOLN
INTRAMUSCULAR | Status: DC | PRN
  Administered 2019-09-27: 4 mg via INTRAVENOUS

## 2019-09-27 MED ORDER — IPRATROPIUM-ALBUTEROL 0.5-2.5 (3) MG/3ML IN SOLN
3.0000 mL | Freq: Once | RESPIRATORY_TRACT | Status: AC
  Administered 2019-09-27: 3 mL via RESPIRATORY_TRACT

## 2019-09-27 MED ORDER — MIDAZOLAM HCL 2 MG/2ML IJ SOLN
INTRAMUSCULAR | Status: DC | PRN
  Administered 2019-09-27: 2 mg via INTRAVENOUS

## 2019-09-27 MED ORDER — ONDANSETRON HCL 4 MG/2ML IJ SOLN: 4.0000 mg | Freq: Once | INTRAMUSCULAR | Status: DC | PRN

## 2019-09-27 MED ORDER — MIDAZOLAM HCL 2 MG/2ML IJ SOLN
INTRAMUSCULAR | Status: AC
  Filled 2019-09-27: qty 2

## 2019-09-27 MED ORDER — FENTANYL CITRATE (PF) 250 MCG/5ML IJ SOLN
INTRAMUSCULAR | Status: AC
  Filled 2019-09-27: qty 5

## 2019-09-27 MED ORDER — HYDROMORPHONE HCL 1 MG/ML IJ SOLN
INTRAMUSCULAR | Status: DC | PRN
  Administered 2019-09-27: .25 mg via INTRAVENOUS

## 2019-09-27 MED ORDER — DEXAMETHASONE SODIUM PHOSPHATE 10 MG/ML IJ SOLN
INTRAMUSCULAR | Status: AC
  Filled 2019-09-27: qty 1

## 2019-09-27 MED ORDER — BUPIVACAINE HCL (PF) 0.5 % IJ SOLN
INTRAMUSCULAR | Status: AC
  Filled 2019-09-27: qty 30

## 2019-09-27 MED ORDER — BUPIVACAINE-EPINEPHRINE 0.5% -1:200000 IJ SOLN
INTRAMUSCULAR | Status: DC | PRN
  Administered 2019-09-27: 30 mL

## 2019-09-27 MED ORDER — SUCCINYLCHOLINE CHLORIDE 20 MG/ML IJ SOLN
INTRAMUSCULAR | Status: DC | PRN
  Administered 2019-09-27: 120 mg via INTRAVENOUS

## 2019-09-27 MED ORDER — KETAMINE HCL 50 MG/ML IJ SOLN
INTRAMUSCULAR | Status: AC
  Filled 2019-09-27: qty 10

## 2019-09-27 MED ORDER — IBUPROFEN 400 MG PO TABS
600.0000 mg | ORAL_TABLET | Freq: Four times a day (QID) | ORAL | Status: DC | PRN
  Administered 2019-09-27 – 2019-09-28 (×2): 600 mg via ORAL
  Filled 2019-09-27 (×2): qty 2

## 2019-09-27 MED ORDER — DEXAMETHASONE SODIUM PHOSPHATE 10 MG/ML IJ SOLN
INTRAMUSCULAR | Status: DC | PRN
  Administered 2019-09-27: 10 mg via INTRAVENOUS

## 2019-09-27 MED ORDER — EPINEPHRINE PF 1 MG/ML IJ SOLN
INTRAMUSCULAR | Status: AC
  Filled 2019-09-27: qty 1

## 2019-09-27 MED ORDER — POTASSIUM CHLORIDE CRYS ER 20 MEQ PO TBCR
20.0000 meq | EXTENDED_RELEASE_TABLET | ORAL | Status: AC
  Administered 2019-09-27 (×2): 20 meq via ORAL
  Filled 2019-09-27 (×2): qty 1

## 2019-09-27 MED ORDER — TRAMADOL HCL 50 MG PO TABS
50.0000 mg | ORAL_TABLET | Freq: Four times a day (QID) | ORAL | Status: DC | PRN
  Administered 2019-09-27: 50 mg via ORAL
  Filled 2019-09-27: qty 1

## 2019-09-27 MED ORDER — DEXMEDETOMIDINE HCL 200 MCG/2ML IV SOLN
INTRAVENOUS | Status: DC | PRN
  Administered 2019-09-27 (×4): 4 ug via INTRAVENOUS

## 2019-09-27 MED ORDER — SEVOFLURANE IN SOLN
RESPIRATORY_TRACT | Status: AC
  Filled 2019-09-27: qty 250

## 2019-09-27 MED ORDER — FENTANYL CITRATE (PF) 100 MCG/2ML IJ SOLN: 25.0000 ug | INTRAMUSCULAR | Status: DC | PRN

## 2019-09-27 MED ORDER — FENTANYL CITRATE (PF) 100 MCG/2ML IJ SOLN
INTRAMUSCULAR | Status: DC | PRN
  Administered 2019-09-27: 50 ug via INTRAVENOUS
  Administered 2019-09-27 (×2): 100 ug via INTRAVENOUS

## 2019-09-27 MED ORDER — HYDROMORPHONE HCL 1 MG/ML IJ SOLN
INTRAMUSCULAR | Status: AC
  Filled 2019-09-27: qty 1

## 2019-09-27 MED ORDER — ONDANSETRON HCL 4 MG/2ML IJ SOLN
INTRAMUSCULAR | Status: AC
  Filled 2019-09-27: qty 2

## 2019-09-27 MED ORDER — LIDOCAINE HCL (CARDIAC) PF 100 MG/5ML IV SOSY
PREFILLED_SYRINGE | INTRAVENOUS | Status: DC | PRN
  Administered 2019-09-27: 100 mg via INTRAVENOUS

## 2019-09-27 MED ORDER — LIDOCAINE HCL (PF) 2 % IJ SOLN
INTRAMUSCULAR | Status: AC
  Filled 2019-09-27: qty 5

## 2019-09-27 MED ORDER — ROCURONIUM BROMIDE 100 MG/10ML IV SOLN
INTRAVENOUS | Status: DC | PRN
  Administered 2019-09-27 (×2): 5 mg via INTRAVENOUS
  Administered 2019-09-27 (×3): 10 mg via INTRAVENOUS
  Administered 2019-09-27: 40 mg via INTRAVENOUS

## 2019-09-27 SURGICAL SUPPLY — 63 items
ADH SKN CLS APL DERMABOND .7 (GAUZE/BANDAGES/DRESSINGS) ×1
ANCHOR TIS RET SYS 235ML (MISCELLANEOUS) ×3 IMPLANT
APPLIER CLIP 5 13 M/L LIGAMAX5 (MISCELLANEOUS)
APR CLP MED LRG 5 ANG JAW (MISCELLANEOUS)
BAG TISS RTRVL C235 10X14 (MISCELLANEOUS) ×1
BLADE SURG SZ11 CARB STEEL (BLADE) ×3 IMPLANT
CANISTER SUCT 1200ML W/VALVE (MISCELLANEOUS) ×3 IMPLANT
CLIP APPLIE 5 13 M/L LIGAMAX5 (MISCELLANEOUS) IMPLANT
COVER WAND RF STERILE (DRAPES) IMPLANT
CUTTER FLEX LINEAR 45M (STAPLE) ×3 IMPLANT
DEFOGGER SCOPE WARMER CLEARIFY (MISCELLANEOUS) ×3 IMPLANT
DERMABOND ADVANCED (GAUZE/BANDAGES/DRESSINGS) ×2
DERMABOND ADVANCED .7 DNX12 (GAUZE/BANDAGES/DRESSINGS) ×1 IMPLANT
ELECT REM PT RETURN 9FT ADLT (ELECTROSURGICAL) ×3
ELECTRODE REM PT RTRN 9FT ADLT (ELECTROSURGICAL) ×1 IMPLANT
GLOVE BIOGEL PI IND STRL 7.0 (GLOVE) ×1 IMPLANT
GLOVE BIOGEL PI INDICATOR 7.0 (GLOVE) ×6
GLOVE SURG SYN 6.5 ES PF (GLOVE) ×9 IMPLANT
GLOVE SURG SYN 6.5 PF PI (GLOVE) ×1 IMPLANT
GOWN STRL REUS W/ TWL LRG LVL3 (GOWN DISPOSABLE) ×1 IMPLANT
GOWN STRL REUS W/TWL LRG LVL3 (GOWN DISPOSABLE) ×9
GRASPER SUT TROCAR 14GX15 (MISCELLANEOUS) ×3 IMPLANT
HANDLE YANKAUER SUCT BULB TIP (MISCELLANEOUS) ×3 IMPLANT
IRRIGATION STRYKERFLOW (MISCELLANEOUS) IMPLANT
IRRIGATOR STRYKERFLOW (MISCELLANEOUS) ×3
IV NS 1000ML (IV SOLUTION) ×3
IV NS 1000ML BAXH (IV SOLUTION) IMPLANT
JACKSON PRATT 10 (INSTRUMENTS) IMPLANT
JACKSON PRATT 7MM (INSTRUMENTS) ×2 IMPLANT
KIT TURNOVER KIT A (KITS) ×3 IMPLANT
L-HOOK LAP DISP 36CM (ELECTROSURGICAL) ×3
LHOOK LAP DISP 36CM (ELECTROSURGICAL) ×1 IMPLANT
LIGASURE LAP MARYLAND 5MM 37CM (ELECTROSURGICAL) IMPLANT
NDL FILTER BLUNT 18X1 1/2 (NEEDLE) IMPLANT
NDL INSUFFLATION 14GA 120MM (NEEDLE) IMPLANT
NEEDLE FILTER BLUNT 18X 1/2SAF (NEEDLE) ×2
NEEDLE FILTER BLUNT 18X1 1/2 (NEEDLE) ×1 IMPLANT
NEEDLE HYPO 22GX1.5 SAFETY (NEEDLE) ×3 IMPLANT
NEEDLE INSUFFLATION 14GA 120MM (NEEDLE) ×3 IMPLANT
NEEDLE VERESS 14GA 120MM (NEEDLE) ×2 IMPLANT
PACK LAP CHOLECYSTECTOMY (MISCELLANEOUS) ×3 IMPLANT
PENCIL ELECTRO HAND CTR (MISCELLANEOUS) ×3 IMPLANT
RELOAD 45 VASCULAR/THIN (ENDOMECHANICALS) ×3 IMPLANT
RELOAD STAPLE 45 2.5 WHT GRN (ENDOMECHANICALS) IMPLANT
RELOAD STAPLE 45 3.5 BLU ETS (ENDOMECHANICALS) ×1 IMPLANT
RELOAD STAPLE TA45 3.5 REG BLU (ENDOMECHANICALS) ×3 IMPLANT
SCISSORS METZENBAUM CVD 33 (INSTRUMENTS) ×1 IMPLANT
SET TUBE SMOKE EVAC HIGH FLOW (TUBING) ×3 IMPLANT
SLEEVE ENDOPATH XCEL 5M (ENDOMECHANICALS) ×3 IMPLANT
SPONGE DRAIN TRACH 4X4 STRL 2S (GAUZE/BANDAGES/DRESSINGS) ×4 IMPLANT
SUT ETHILON 3-0 FS-10 30 BLK (SUTURE) ×3
SUT MNCRL AB 4-0 PS2 18 (SUTURE) ×3 IMPLANT
SUT VIC AB 3-0 SH 27 (SUTURE) ×3
SUT VIC AB 3-0 SH 27X BRD (SUTURE) ×1 IMPLANT
SUT VICRYL 0 AB UR-6 (SUTURE) ×8 IMPLANT
SUT VICRYL PLUS ABS 0 54 (SUTURE) ×3 IMPLANT
SUTURE EHLN 3-0 FS-10 30 BLK (SUTURE) IMPLANT
SYR TB 1ML LUER SLIP (SYRINGE) ×2 IMPLANT
SYSTEM WECK SHIELD CLOSURE (TROCAR) ×2 IMPLANT
TRAY FOLEY MTR SLVR 16FR STAT (SET/KITS/TRAYS/PACK) ×3 IMPLANT
TROCAR XCEL 12X100 BLDLESS (ENDOMECHANICALS) ×2 IMPLANT
TROCAR XCEL BLUNT TIP 100MML (ENDOMECHANICALS) IMPLANT
TROCAR XCEL NON-BLD 5MMX100MML (ENDOMECHANICALS) ×3 IMPLANT

## 2019-09-27 NOTE — Anesthesia Post-op Follow-up Note (Signed)
Anesthesia QCDR form completed.        

## 2019-09-27 NOTE — Anesthesia Procedure Notes (Signed)
Procedure Name: Intubation Date/Time: 09/27/2019 1:02 AM Performed by: Lendon Colonel, CRNA Pre-anesthesia Checklist: Patient identified, Patient being monitored, Timeout performed, Emergency Drugs available and Suction available Patient Re-evaluated:Patient Re-evaluated prior to induction Oxygen Delivery Method: Circle system utilized Preoxygenation: Pre-oxygenation with 100% oxygen Induction Type: IV induction and Rapid sequence Laryngoscope Size: 3 and McGraph Grade View: Grade I Tube type: Oral Tube size: 7.0 mm Number of attempts: 1 Airway Equipment and Method: Stylet Placement Confirmation: ETT inserted through vocal cords under direct vision,  positive ETCO2 and breath sounds checked- equal and bilateral Secured at: 21 cm Tube secured with: Tape Dental Injury: Teeth and Oropharynx as per pre-operative assessment

## 2019-09-27 NOTE — Op Note (Signed)
Preoperative diagnosis: Acute appendicitis.  Postoperative diagnosis: Acute appendicitis with perforation and generalized peritonitis  Procedure: Laparoscopic appendectomy.  Anesthesia: GETA  Surgeon: Benjamine Sprague  Wound Classification: clean contaminated  Specimen: Appendix  Complications: None  Estimated Blood Loss: 30 mL   Indications: Patient is a 28 y.o. male  presented with right lower quadrant pain.  Computed tomography scan and physical examination were consistent with acute appendicitis.  Initial IV antibiotic management failed due to increasing pain and fever development, therefore patient was taken to the OR for laparoscopic appendectomy  Findings: 1. Acutely inflamed appendix 2. peri-appendiceal abscess with purulent fluid throughout entire abdominal cavity 3. Normal anatomy 4. Appendiceal artery ligated and divided with EndoGIA 5. Adequate hemostasis.   Description of procedure: The patient was placed on the operating table in the supine position, left arm tucked. General anesthesia was induced. A time-out was completed verifying correct patient, procedure, site, positioning, and implant(s) and/or special equipment prior to beginning this procedure. A Foley catheter placed. The abdomen was prepped and draped in the usual sterile fashion.   Veress needle inserted at Palmer's point and after confirming 2 clicks on the needle along with the positive saline drop test, abdomen was insufflated with carbon dioxide to a pressure of 15 mmHg. The patient tolerated insufflation well.  Infraumbilical incision was made and a 5 mm port was placed via the Optiview technique through this incision.  Local was infused to the area prior to incision. The laparoscope was inserted and the abdomen inspected. No injuries from initial trocar placement were noted. One 43mm port placed in LLQ under direct visualization.  The 5 mm port placed in the infraumbilical area was then removed and replaced  with a 12 mm port, and an additional suprapubic incision was made and the 5 mm port was placed through this new site.  Care was taken to avoid injury to the bladder or inferior epigastric vessels. The table was placed in the Trendelenburg position with the right side elevated.  Copious amounts of purulent fluid was noted throughout the dependent portions of the abdomen.  All of this was suctioned out. Omental adhesions to the right lateral abdominal wall was gently peeled away and a inflamed appendix was eventually identified and elevated out of the developing abscess cavity.  Window created at base of appendix clearly visible at the base of the cecum in the mesentery.   An endoscopic blue load linear cutting stapler was then used to divide and staple the base of the appendix. It was reloaded with a vascular cartridge and the mesoappendix similarly divided.  The appendix was placed in an endoscopic retrieval bag and removed.   The appendiceal stump was examined and hemostasis noted. Remaining serosanguinous fluid and blood suctioned out.  No other pathology was identified within pelvis. Drain placed through suprapubic port at cecum. Secured to skin with 3-0 nylon. The umbilical trocar removed and port site closed with Efx shield using 0 vicryl under direct vision. Remaining trocars were removed under direct vision. No bleeding was noted.The abdomen was allowed to collapse. Deep dermal then closed with 3-0 vicryl interrupted fashion at 66mm port site.  All skin incisions then closed with subcuticular sutures Monocryl 4-0.  Wounds then dressed with dermabond.  The patient tolerated the procedure well, foley removed, awakened from anesthesia and was taken to the postanesthesia care unit in satisfactory condition.  Sponge count and instrument count correct at the end of the procedure.

## 2019-09-27 NOTE — Progress Notes (Signed)
Dr. Lysle Pearl came to assess patient and decided to take him to surgery for lap app. Consents were signed, CHG bath given, and SCD's placed on patient. Transport came to get patient and send him to OR. Will continue to monitor. Report given to [place] in the OR.  Christene Slates

## 2019-09-27 NOTE — Anesthesia Preprocedure Evaluation (Signed)
Anesthesia Evaluation  Patient identified by MRN, date of birth, ID band Patient awake    Reviewed: Allergy & Precautions, NPO status , Patient's Chart, lab work & pertinent test results  Airway Mallampati: II  TM Distance: >3 FB     Dental  (+) Teeth Intact   Pulmonary asthma , Current Smoker,    Pulmonary exam normal        Cardiovascular negative cardio ROS Normal cardiovascular exam     Neuro/Psych negative neurological ROS  negative psych ROS   GI/Hepatic Neg liver ROS,   Endo/Other  negative endocrine ROS  Renal/GU negative Renal ROS  negative genitourinary   Musculoskeletal negative musculoskeletal ROS (+)   Abdominal Normal abdominal exam  (+)   Peds negative pediatric ROS (+)  Hematology negative hematology ROS (+)   Anesthesia Other Findings Past Medical History: No date: Asthma  Reproductive/Obstetrics                             Anesthesia Physical Anesthesia Plan  ASA: II and emergent  Anesthesia Plan: General   Post-op Pain Management:    Induction: Intravenous, Rapid sequence and Cricoid pressure planned  PONV Risk Score and Plan:   Airway Management Planned: Oral ETT  Additional Equipment:   Intra-op Plan:   Post-operative Plan: Extubation in OR  Informed Consent: I have reviewed the patients History and Physical, chart, labs and discussed the procedure including the risks, benefits and alternatives for the proposed anesthesia with the patient or authorized representative who has indicated his/her understanding and acceptance.     Dental advisory given  Plan Discussed with: CRNA and Surgeon  Anesthesia Plan Comments:         Anesthesia Quick Evaluation

## 2019-09-27 NOTE — Transfer of Care (Signed)
Immediate Anesthesia Transfer of Care Note  Patient: Jacob Huerta  Procedure(s) Performed: APPENDECTOMY LAPAROSCOPIC (N/A )  Patient Location: PACU  Anesthesia Type:General  Level of Consciousness: sedated and patient cooperative  Airway & Oxygen Therapy: Patient Spontanous Breathing and Patient connected to face mask oxygen  Post-op Assessment: Report given to RN and Post -op Vital signs reviewed and stable  Post vital signs: Reviewed and stable  Last Vitals:  Vitals Value Taken Time  BP    Temp    Pulse 104 09/27/19 0303  Resp 27 09/27/19 0303  SpO2 94 % 09/27/19 0303  Vitals shown include unvalidated device data.  Last Pain:  Vitals:   09/26/19 2338  TempSrc: Oral  PainSc:       Patients Stated Pain Goal: 2 (38/18/40 3754)  Complications: No apparent anesthesia complications

## 2019-09-28 LAB — SURGICAL PATHOLOGY

## 2019-09-28 MED ORDER — AMOXICILLIN-POT CLAVULANATE 875-125 MG PO TABS: 1.0000 | ORAL_TABLET | Freq: Two times a day (BID) | ORAL | 0 refills | Status: AC

## 2019-09-28 MED ORDER — DOCUSATE SODIUM 100 MG PO CAPS: 100.0000 mg | ORAL_CAPSULE | Freq: Two times a day (BID) | ORAL | 0 refills | Status: AC | PRN

## 2019-09-28 MED ORDER — HYDROMORPHONE HCL 1 MG/ML IJ SOLN: 0.5000 mg | INTRAMUSCULAR | Status: DC | PRN

## 2019-09-28 MED ORDER — IBUPROFEN 800 MG PO TABS: 800.0000 mg | ORAL_TABLET | Freq: Three times a day (TID) | ORAL | 0 refills | Status: DC | PRN

## 2019-09-28 MED ORDER — AMOXICILLIN-POT CLAVULANATE 875-125 MG PO TABS
1.0000 | ORAL_TABLET | Freq: Two times a day (BID) | ORAL | Status: DC
  Administered 2019-09-28: 1 via ORAL
  Filled 2019-09-28: qty 1

## 2019-09-28 MED ORDER — ACETAMINOPHEN 325 MG PO TABS: 650.0000 mg | ORAL_TABLET | Freq: Three times a day (TID) | ORAL | 0 refills | Status: AC | PRN

## 2019-09-28 MED ORDER — PANTOPRAZOLE SODIUM 40 MG PO TBEC: 40.0000 mg | DELAYED_RELEASE_TABLET | Freq: Every day | ORAL | Status: DC

## 2019-09-28 MED ORDER — OXYCODONE-ACETAMINOPHEN 5-325 MG PO TABS: 1.0000 | ORAL_TABLET | Freq: Four times a day (QID) | ORAL | 0 refills | Status: AC | PRN

## 2019-09-28 MED ORDER — OXYCODONE HCL 5 MG PO TABS
5.0000 mg | ORAL_TABLET | Freq: Four times a day (QID) | ORAL | Status: DC | PRN
  Administered 2019-09-28: 5 mg via ORAL
  Filled 2019-09-28: qty 1

## 2019-09-28 NOTE — Progress Notes (Signed)
PHARMACIST - PHYSICIAN COMMUNICATION  DR:   Lysle Pearl  CONCERNING: IV to Oral Route Change Policy  RECOMMENDATION: This patient is receiving pantoprazole by the intravenous route.  Based on criteria approved by the Pharmacy and Therapeutics Committee, the intravenous medication(s) is/are being converted to the equivalent oral dose form(s).   DESCRIPTION: These criteria include:  The patient is eating (either orally or via tube) and/or has been taking other orally administered medications for a least 24 hours  The patient has no evidence of active gastrointestinal bleeding or impaired GI absorption (gastrectomy, short bowel, patient on TNA or NPO).  If you have questions about this conversion, please contact the Pharmacy Department  []   [phone] )  [facility] [x]   [phone] )  [facility] []   [phone] )  [facility] []   [phone] )  [facility] []   [phone] )  [place], [facility] 09/28/2019 1:56 PM

## 2019-09-28 NOTE — Progress Notes (Signed)
Jacob Huerta to be D/C'd home with mother per MD order.  Discussed prescriptions and follow up appointments with the patient. Prescriptions given to patient, medication list explained in detail. Pt verbalized understanding.  Allergies as of 09/28/2019       Reactions   Hydrocodone    Vomiting, itching        Medication List     TAKE these medications    acetaminophen 325 MG tablet Commonly known as: Tylenol Take 2 tablets (650 mg total) by mouth every 8 (eight) hours as needed for mild pain.   albuterol 108 (90 Base) MCG/ACT inhaler Commonly known as: VENTOLIN HFA Inhale 2 puffs into the lungs every 6 (six) hours as needed. For breathing   amoxicillin-clavulanate 875-125 MG tablet Commonly known as: Augmentin Take 1 tablet by mouth 2 (two) times daily for 7 days.   docusate sodium 100 MG capsule Commonly known as: Colace Take 1 capsule (100 mg total) by mouth 2 (two) times daily as needed for up to 10 days for mild constipation.   ibuprofen 800 MG tablet Commonly known as: ADVIL Take 1 tablet (800 mg total) by mouth every 8 (eight) hours as needed for mild pain or moderate pain.   oxyCODONE-acetaminophen 5-325 MG tablet Commonly known as: Percocet Take 1 tablet by mouth every 6 (six) hours as needed for up to 3 days for severe pain.        Vitals:   09/28/19 0449 09/28/19 1218  BP: 112/71 121/74  Pulse: 72 86  Resp: 18   Temp: 97.8 F (36.6 C) (!) 97.5 F (36.4 C)  SpO2: 98% 97%    Skin clean, dry and intact without evidence of skin break down, no evidence of skin tears noted. IV catheter discontinued intact. Site without signs and symptoms of complications. Dressing and pressure applied. Pt denies pain at this time. No complaints noted.  An After Visit Summary was printed and given to the patient. Patient escorted via [place], and D/C home via private auto.  [place] [name]

## 2019-09-28 NOTE — Discharge Summary (Signed)
Physician Discharge Summary  Patient ID: CARREL LEATHER MRN: [id number] DOB/AGE: [birth year] 28 y.o.  Admit date: 09/26/2019 Discharge date: 09/28/2019  Admission Diagnoses: acute appendicitic  Discharge Diagnoses:  Same as above  Discharged Condition: good  Hospital Course: admitted for above.  Failed abx management, so taken to OR.  See Op note for details.  Post op, recovered well, tolerating diet, pain controlled.  Home with abx and drain in place.  Consults: None  Discharge Exam: Blood pressure 121/74, pulse 86, temperature (!) 97.5 F (36.4 C), resp. rate 18, height 5\' 8"  (1.727 m), weight 106.6 kg, SpO2 97 %. General appearance: alert, cooperative and no distress GI: soft, TTP in RLQ as expected.  drain with serosanguinous fluid  Disposition:  Discharge disposition: 01-Home or Self Care       Discharge Instructions    Discharge patient   Complete by: As directed    Discharge disposition: 01-Home or Self Care   Discharge patient date: 09/28/2019     Allergies as of 09/28/2019      Reactions   Hydrocodone    Vomiting, itching      Medication List    TAKE these medications   acetaminophen 325 MG tablet Commonly known as: Tylenol Take 2 tablets (650 mg total) by mouth every 8 (eight) hours as needed for mild pain.   albuterol 108 (90 Base) MCG/ACT inhaler Commonly known as: VENTOLIN HFA Inhale 2 puffs into the lungs every 6 (six) hours as needed. For breathing   amoxicillin-clavulanate 875-125 MG tablet Commonly known as: Augmentin Take 1 tablet by mouth 2 (two) times daily for 7 days.   docusate sodium 100 MG capsule Commonly known as: Colace Take 1 capsule (100 mg total) by mouth 2 (two) times daily as needed for up to 10 days for mild constipation.   ibuprofen 800 MG tablet Commonly known as: ADVIL Take 1 tablet (800 mg total) by mouth every 8 (eight) hours as needed for mild pain or moderate pain.   oxyCODONE-acetaminophen 5-325 MG  tablet Commonly known as: Percocet Take 1 tablet by mouth every 6 (six) hours as needed for up to 3 days for severe pain.      Follow-up Information    Yarissa Reining, DO Follow up in 1 week(s).   Specialty: Surgery Why: postop and possible drain removal Contact information: [address] [place] [address] [phone]            Total time spent arranging discharge was >51min. Signed: Benjamine Sprague 09/28/2019, 5:01 PM

## 2019-09-28 NOTE — Anesthesia Postprocedure Evaluation (Signed)
Anesthesia Post Note  Patient: Jacob Huerta  Procedure(s) Performed: APPENDECTOMY LAPAROSCOPIC (N/A )  Patient location during evaluation: PACU Anesthesia Type: General Level of consciousness: awake and alert and oriented Pain management: pain level controlled Vital Signs Assessment: post-procedure vital signs reviewed and stable Respiratory status: spontaneous breathing Cardiovascular status: blood pressure returned to baseline Anesthetic complications: no     Last Vitals:  Vitals:   09/27/19 2038 09/28/19 0449  BP: 118/80 112/71  Pulse: 74 72  Resp: 18 18  Temp: 36.5 C 36.6 C  SpO2: 97% 98%    Last Pain:  Vitals:   09/28/19 1000  TempSrc:   PainSc: 7                  Adiel Mcnamara

## 2019-09-28 NOTE — Discharge Instructions (Signed)
Laparoscopic Appendectomy, Care After This sheet gives you information about how to care for yourself after your procedure. Your doctor may also give you more specific instructions. If you have problems or questions, contact your doctor. Follow these instructions at home: Care for cuts from surgery (incisions)   Follow instructions from your doctor about how to take care of your cuts from surgery. Make sure you: ? Wash your hands with soap and water before you change your bandage (dressing). If you cannot use soap and water, use hand sanitizer. ? Change your bandage as told by your doctor. ? Leave stitches (sutures), skin glue, or skin tape (adhesive) strips in place. They may need to stay in place for 2 weeks or longer. If tape strips get loose and curl up, you may trim the loose edges. Do not remove tape strips completely unless your doctor says it is okay.  Do not take baths, swim, or use a hot tub until your doctor says it is okay. OK TO SHOWER 24HRS AFTER YOUR SURGERY.   Check your surgical cut area every day for signs of infection. Check for: ? More redness, swelling, or pain. ? More fluid or blood. ? Warmth. ? Pus or a bad smell. Activity  Do not drive or use heavy machinery while taking prescription pain medicine.  Do not play contact sports until your doctor says it is okay.  Do not drive for 24 hours if you were given a medicine to help you relax (sedative).  Rest as needed. Do not return to work or school until your doctor says it is okay. General instructions .  tylenol and advil as needed for discomfort.  Please alternate between the two every four hours as needed for pain.   .  Use narcotics, if prescribed, only when tylenol and motrin is not enough to control pain. .  325-650mg every 8hrs to max of 3000mg/24hrs (including the 325mg in every norco dose) for the tylenol.   .  Advil up to 800mg per dose every 8hrs as needed for pain.    To prevent or treat constipation  while you are taking prescription pain medicine, your doctor may recommend that you: ? Drink enough fluid to keep your pee (urine) clear or pale yellow. ? Take over-the-counter or prescription medicines. ? Eat foods that are high in fiber, such as fresh fruits and vegetables, whole grains, and beans. ? Limit foods that are high in fat and processed sugars, such as fried and sweet foods. Contact a doctor if:  You develop a rash.  You have more redness, swelling, or pain around your surgical cuts.  You have more fluid or blood coming from your surgical cuts.  Your surgical cuts feel warm to the touch.  You have pus or a bad smell coming from your surgical cuts.  You have a fever.  One or more of your surgical cuts breaks open. Get help right away if:  You have trouble breathing.  You have chest pain.  You have pain that is getting worse in your shoulders.  You faint or feel dizzy when you stand.  You have very bad pain in your belly (abdomen).  You are sick to your stomach (nauseous) for more than one day.  You have throwing up (vomiting) that lasts for more than one day.  You have leg pain. This information is not intended to replace advice given to you by your health care provider. Make sure you discuss any questions you have with your   health care provider. Document Released: 06/23/2008 Document Revised: 04/04/2016 Document Reviewed: 03/02/2016 Elsevier Interactive Patient Education  2019 [employer]   

## 2020-01-04 ENCOUNTER — Inpatient Hospital Stay
Admission: EM | Admit: 2020-01-04 | Discharge: 2020-01-10 | DRG: 392 | Disposition: A | Discharge status: Home / Self Care | Payer: Self-pay | Attending: Surgery | Admitting: Surgery

## 2020-01-04 ENCOUNTER — Emergency Department: Payer: Self-pay

## 2020-01-04 ENCOUNTER — Other Ambulatory Visit: Payer: Self-pay

## 2020-01-04 ENCOUNTER — Encounter: Payer: Self-pay | Admitting: Emergency Medicine

## 2020-01-04 DIAGNOSIS — K5732 Diverticulitis of large intestine without perforation or abscess without bleeding: Secondary | ICD-10-CM

## 2020-01-04 DIAGNOSIS — K5720 Diverticulitis of large intestine with perforation and abscess without bleeding: Principal | ICD-10-CM

## 2020-01-04 DIAGNOSIS — E876 Hypokalemia: Secondary | ICD-10-CM | POA: Diagnosis present

## 2020-01-04 DIAGNOSIS — F1010 Alcohol abuse, uncomplicated: Secondary | ICD-10-CM | POA: Diagnosis present

## 2020-01-04 DIAGNOSIS — J45909 Unspecified asthma, uncomplicated: Secondary | ICD-10-CM | POA: Diagnosis present

## 2020-01-04 DIAGNOSIS — Z9049 Acquired absence of other specified parts of digestive tract: Secondary | ICD-10-CM

## 2020-01-04 DIAGNOSIS — R748 Abnormal levels of other serum enzymes: Secondary | ICD-10-CM

## 2020-01-04 DIAGNOSIS — Z8719 Personal history of other diseases of the digestive system: Secondary | ICD-10-CM

## 2020-01-04 DIAGNOSIS — Z20822 Contact with and (suspected) exposure to covid-19: Secondary | ICD-10-CM | POA: Diagnosis present

## 2020-01-04 DIAGNOSIS — Z885 Allergy status to narcotic agent status: Secondary | ICD-10-CM

## 2020-01-04 DIAGNOSIS — F17210 Nicotine dependence, cigarettes, uncomplicated: Secondary | ICD-10-CM | POA: Diagnosis present

## 2020-01-04 DIAGNOSIS — K651 Peritoneal abscess: Secondary | ICD-10-CM

## 2020-01-04 HISTORY — DX: Diverticulitis of large intestine without perforation or abscess without bleeding: K57.32

## 2020-01-04 LAB — COMPREHENSIVE METABOLIC PANEL
ALT: 73 U/L — ABNORMAL HIGH (ref 0–44)
AST: 57 U/L — ABNORMAL HIGH (ref 15–41)
Albumin: 4.2 g/dL (ref 3.5–5.0)
Alkaline Phosphatase: 64 U/L (ref 38–126)
Anion gap: 11 (ref 5–15)
BUN: 11 mg/dL (ref 6–20)
CO2: 22 mmol/L (ref 22–32)
Calcium: 8.9 mg/dL (ref 8.9–10.3)
Chloride: 102 mmol/L (ref 98–111)
Creatinine, Ser: 0.8 mg/dL (ref 0.61–1.24)
GFR calc Af Amer: >60 mL/min (ref >60)
GFR calc non Af Amer: >60 mL/min (ref >60)
Glucose, Bld: 136 mg/dL — ABNORMAL HIGH (ref 70–99)
Potassium: 3.4 mmol/L — ABNORMAL LOW (ref 3.5–5.1)
Sodium: 135 mmol/L (ref 135–145)
Total Bilirubin: 3.5 mg/dL — ABNORMAL HIGH (ref 0.3–1.2)
Total Protein: 7.9 g/dL (ref 6.5–8.1)

## 2020-01-04 LAB — CBC
HCT: 49.2 % (ref 39.0–52.0)
Hemoglobin: 17.8 g/dL — ABNORMAL HIGH (ref 13.0–17.0)
MCH: 34.6 pg — ABNORMAL HIGH (ref 26.0–34.0)
MCHC: 36.2 g/dL — ABNORMAL HIGH (ref 30.0–36.0)
MCV: 95.5 fL (ref 80.0–100.0)
Platelets: 191 10*3/uL (ref 150–400)
RBC: 5.15 MIL/uL (ref 4.22–5.81)
RDW: 12.3 % (ref 11.5–15.5)
WBC: 18.3 10*3/uL — ABNORMAL HIGH (ref 4.0–10.5)
nRBC: 0 % (ref 0.0–0.2)

## 2020-01-04 LAB — LIPASE, BLOOD: Lipase: 14 U/L (ref 11–51)

## 2020-01-04 LAB — URINALYSIS, COMPLETE (UACMP) WITH MICROSCOPIC
Bacteria, UA: NONE SEEN
Bilirubin Urine: NEGATIVE
Glucose, UA: NEGATIVE mg/dL
Hgb urine dipstick: NEGATIVE
Ketones, ur: NEGATIVE mg/dL
Leukocytes,Ua: NEGATIVE
Nitrite: NEGATIVE
Protein, ur: 100 mg/dL — AB
Specific Gravity, Urine: 1.027 (ref 1.005–1.030)
Squamous Epithelial / HPF: NONE SEEN (ref 0–5)
pH: 6 (ref 5.0–8.0)

## 2020-01-04 MED ORDER — IOHEXOL 350 MG/ML SOLN
100.0000 mL | Freq: Once | INTRAVENOUS | Status: AC | PRN
  Administered 2020-01-04: 100 mL via INTRAVENOUS

## 2020-01-04 MED ORDER — KETOROLAC TROMETHAMINE 30 MG/ML IJ SOLN
30.0000 mg | Freq: Four times a day (QID) | INTRAMUSCULAR | Status: AC
  Administered 2020-01-04 – 2020-01-09 (×15): 30 mg via INTRAVENOUS
  Filled 2020-01-04 (×17): qty 1

## 2020-01-04 MED ORDER — PIPERACILLIN-TAZOBACTAM 3.375 G IVPB 30 MIN
3.3750 g | Freq: Once | INTRAVENOUS | Status: AC
  Administered 2020-01-04: 3.375 g via INTRAVENOUS
  Filled 2020-01-04: qty 50

## 2020-01-04 MED ORDER — ONDANSETRON 4 MG PO TBDP: 4.0000 mg | ORAL_TABLET | Freq: Four times a day (QID) | ORAL | Status: DC | PRN

## 2020-01-04 MED ORDER — HYDROMORPHONE HCL 1 MG/ML IJ SOLN
0.5000 mg | INTRAMUSCULAR | Status: DC | PRN
  Administered 2020-01-05: 0.5 mg via INTRAVENOUS
  Filled 2020-01-04: qty 1

## 2020-01-04 MED ORDER — ONDANSETRON HCL 4 MG/2ML IJ SOLN: 4.0000 mg | Freq: Four times a day (QID) | INTRAMUSCULAR | Status: DC | PRN

## 2020-01-04 MED ORDER — ALBUTEROL SULFATE (2.5 MG/3ML) 0.083% IN NEBU: 2.5000 mg | INHALATION_SOLUTION | Freq: Four times a day (QID) | RESPIRATORY_TRACT | Status: DC | PRN

## 2020-01-04 MED ORDER — MORPHINE SULFATE (PF) 2 MG/ML IV SOLN
2.0000 mg | Freq: Once | INTRAVENOUS | Status: AC
  Administered 2020-01-04: 2 mg via INTRAVENOUS
  Filled 2020-01-04: qty 1

## 2020-01-04 MED ORDER — PANTOPRAZOLE SODIUM 40 MG IV SOLR
40.0000 mg | Freq: Every day | INTRAVENOUS | Status: DC
  Administered 2020-01-05 – 2020-01-09 (×6): 40 mg via INTRAVENOUS
  Filled 2020-01-04 (×6): qty 40

## 2020-01-04 MED ORDER — ENOXAPARIN SODIUM 40 MG/0.4ML ~~LOC~~ SOLN
40.0000 mg | SUBCUTANEOUS | Status: DC
  Administered 2020-01-06 – 2020-01-07 (×2): 40 mg via SUBCUTANEOUS
  Filled 2020-01-04 (×2): qty 0.4

## 2020-01-04 MED ORDER — PIPERACILLIN-TAZOBACTAM 3.375 G IVPB
3.3750 g | Freq: Three times a day (TID) | INTRAVENOUS | Status: DC
  Administered 2020-01-05 – 2020-01-10 (×16): 3.375 g via INTRAVENOUS
  Filled 2020-01-04 (×16): qty 50

## 2020-01-04 MED ORDER — ONDANSETRON HCL 4 MG/2ML IJ SOLN
4.0000 mg | Freq: Once | INTRAMUSCULAR | Status: AC
  Administered 2020-01-04: 22:00:00 4 mg via INTRAVENOUS
  Filled 2020-01-04: qty 2

## 2020-01-04 MED ORDER — LACTATED RINGERS IV SOLN
125.0000 mL/h | INTRAVENOUS | Status: DC
  Administered 2020-01-04 – 2020-01-09 (×12): 125 mL/h via INTRAVENOUS

## 2020-01-04 NOTE — ED Notes (Signed)
Surgeon at bedside.  

## 2020-01-04 NOTE — ED Provider Notes (Signed)
[facility] Emergency Department Provider Note  ____________________________________________   First MD Initiated Contact with Patient 01/04/20 2153     (approximate)  I have reviewed the triage vital signs and the nursing notes.   HISTORY  Chief Complaint Abdominal Pain   HPI Jacob Huerta is a 29 y.o. male with below list of previous medical conditions including appendectomy January 2021 presents to the emergency department secondary to acute onset of right lower quadrant abdominal pain which began yesterday.  Patient states that he thought it may be secondary to her constipation and so he took a laxative without any improvement.  Patient states that he has had 6 episode of diarrhea today.  States that current pain score is 8 out of 10       Past Medical History:  Diagnosis Date  . Asthma     Patient Active Problem List   Diagnosis Date Noted  . Acute appendicitis 09/26/2019    Past Surgical History:  Procedure Laterality Date  . LAPAROSCOPIC APPENDECTOMY N/A 09/27/2019   Procedure: APPENDECTOMY LAPAROSCOPIC;  Surgeon: Benjamine Sprague, DO;  Location: [facility] ORS;  Service: General;  Laterality: N/A;  . OTHER SURGICAL HISTORY     jaw surgery, rotator cuff surgery    Prior to Admission medications   Medication Sig Start Date End Date Taking? Authorizing Provider  albuterol (PROVENTIL HFA;VENTOLIN HFA) 108 (90 BASE) MCG/ACT inhaler Inhale 2 puffs into the lungs every 6 (six) hours as needed. For breathing    [provider]  ibuprofen (ADVIL) 800 MG tablet Take 1 tablet (800 mg total) by mouth every 8 (eight) hours as needed for mild pain or moderate pain. 09/28/19   Benjamine Sprague, DO    Allergies Hydrocodone  No family history on file.  Social History Social History   Tobacco Use  . Smoking status: Current Every Day Smoker    Packs/day: 0.25    Types: Cigarettes  . Smokeless tobacco: Never Used  Substance Use Topics  . Alcohol  use: Yes    Comment: occ  . Drug use: No    Review of Systems Constitutional: No fever/chills Eyes: No visual changes. ENT: No sore throat. Cardiovascular: Denies chest pain. Respiratory: Denies shortness of breath. Gastrointestinal: Positive for abdominal pain nausea and diarrhea. Genitourinary: Negative for dysuria. Musculoskeletal: Negative for neck pain.  Negative for back pain. Integumentary: Negative for rash. Neurological: Negative for headaches, focal weakness or numbness.   ____________________________________________   PHYSICAL EXAM:  VITAL SIGNS: ED Triage Vitals  Enc Vitals Group     BP 01/04/20 1901 (!) 163/87     Pulse Rate 01/04/20 1901 (!) 101     Resp 01/04/20 1901 16     Temp 01/04/20 1901 99.6 F (37.6 C)     Temp Source 01/04/20 1901 Oral     SpO2 01/04/20 1901 95 %     Weight 01/04/20 1902 108.9 kg (240 lb)     Height 01/04/20 1902 1.727 m (5\' 8" )     Head Circumference --      Peak Flow --      Pain Score 01/04/20 1902 8     Pain Loc --      Pain Edu? --      Excl. in [place]? --     Constitutional: Alert and oriented.  Eyes: Conjunctivae are normal.  Mouth/Throat: Patient is wearing a mask. Neck: No stridor.  No meningeal signs.   Cardiovascular: Normal rate, regular rhythm. Good peripheral circulation. Grossly  normal heart sounds. Respiratory: Normal respiratory effort.  No retractions. Gastrointestinal: Generalized tenderness to palpation worse in the right lower quadrant.  Voluntary guarding no rebound Musculoskeletal: No lower extremity tenderness nor edema. No gross deformities of extremities. Neurologic:  Normal speech and language. No gross focal neurologic deficits are appreciated.  Skin:  Skin is warm, dry and intact. Psychiatric: Mood and affect are normal. Speech and behavior are normal.  ____________________________________________   LABS (all labs ordered are listed, but only abnormal results are displayed)  Labs Reviewed    COMPREHENSIVE METABOLIC PANEL - Abnormal; Notable for the following components:      Result Value   Potassium 3.4 (*)    Glucose, Bld 136 (*)    AST 57 (*)    ALT 73 (*)    Total Bilirubin 3.5 (*)    All other components within normal limits  CBC - Abnormal; Notable for the following components:   WBC 18.3 (*)    Hemoglobin 17.8 (*)    MCH 34.6 (*)    MCHC 36.2 (*)    All other components within normal limits  URINALYSIS, COMPLETE (UACMP) WITH MICROSCOPIC - Abnormal; Notable for the following components:   Color, Urine AMBER (*)    APPearance CLEAR (*)    Protein, ur 100 (*)    All other components within normal limits  LIPASE, BLOOD    RADIOLOGY I, [place] N Symone Cornman, personally viewed and evaluated these images (plain radiographs) as part of my medical decision making, as well as reviewing the written report by the radiologist.  ED MD interpretation:    Official radiology report(s): CT Abdomen Pelvis W Contrast  Result Date: 01/04/2020 CLINICAL DATA:  Lower abdominal pain with diarrhea and leukocytosis. Abdominal tenderness. Appendectomy 3 months ago. EXAM: CT ABDOMEN AND PELVIS WITH CONTRAST TECHNIQUE: Multidetector CT imaging of the abdomen and pelvis was performed using the standard protocol following bolus administration of intravenous contrast. CONTRAST:  OMNIPAQUE IOHEXOL 350 MG/ML SOLN COMPARISON:  Most recent CT 09/26/2019 FINDINGS: Lower chest: Subsegmental atelectasis in both lower lobes. No pleural fluid. Hepatobiliary: Enlarged liver spanning 24 cm cranial caudal. Advanced hepatic steatosis. More geographic areas of decreased hepatic density likely represent focal fatty deposition. Gallbladder physiologically distended, no calcified stone. No biliary dilatation. Pancreas: No ductal dilatation or inflammation. Spleen: Enlarged spanning 16.6 cm cranial caudal. No focal abnormality. Adrenals/Urinary Tract: Normal adrenal glands. No hydronephrosis or perinephric edema.  Homogeneous renal enhancement. Small cyst in the medial left kidney is incompletely characterized. Urinary bladder is near completely empty and not well assessed. Stomach/Bowel: Appendectomy 3 months prior. Fat stranding and inflammatory change involves the right lower quadrant and pericolic gutter with small amount of free fluid tracking from the pelvis to the subhepatic space. Multiple diverticula involve the cecum and proximal ascending colon. No free air, however there is a thin car shin tech peripherally enhancing collection abutting the cecum that is suspicious for abscess measuring 4.5 x 1.7 x 7.3 cm, series 2, image 64. this fluid collection abuts the surgical clips adjacent to the cecum. Distal small bowel loops are dilated and fluid-filled with mild mucosal enhancement, possibly reactive. Proximal small bowel and stomach are normal. Majority of the colon is nondistended, but no additional sites of colonic inflammation. There is liquid stool in the distal colon. Vascular/Lymphatic: Normal caliber abdominal aorta. Multiple small periportal, central mesenteric, and retroperitoneal nodes are likely reactive. Reproductive: Prostate is unremarkable. Other: Inflammatory change in the right lower quadrant with fat stranding and small amount  of free fluid. Free fluid tracks into the pelvis and right pericolic gutter in the subhepatic space. Right lower quadrant fluid collection as described above. Small fat containing umbilical hernia. Musculoskeletal: There are no acute or suspicious osseous abnormalities. IMPRESSION: 1. Right lower quadrant pericecal fluid collection measuring 4.5 x 1.7 x 7.3 cm is suspicious for abscess with surrounding inflammatory change. This abuts the appendectomy clips adjacent to the cecum, and is in the region of multiple cecal and ascending colonic diverticula. Given 3 months since prior appendectomy, a postoperative abscess would be unusual, and findings are favored to represent a  diverticular abscess rather than postoperative. No appendiceal stump is visualized to suggest stump appendicitis. No free air. 2. Liquid stool in the colon suggesting diarrheal process. 3. Dilated fluid-filled distal small bowel loops with mild mucosal enhancement, possibly reactive. 4. Hepatosplenomegaly and advanced hepatic steatosis. Electronically Signed   By: Narda Rutherford M.D.   On: 01/04/2020 21:43      Procedures   ____________________________________________   INITIAL IMPRESSION / MDM / ASSESSMENT AND PLAN / ED COURSE  As part of my medical decision making, I reviewed the following data within the electronic MEDICAL RECORD NUMBER   29 year old male presented with above-stated history and physical exam secondary to abdominal pain.  Laboratory data notable for white blood cell count of 18.3 CT scan of the abdomen and pelvis revealed a pericecal fluid collection consistent with abscess 4-1/2 x 7.3 cm.  Patient given IV Zosyn 3.375 mg.  Patient also given IV morphine 2 mg Zofran 4 mg with improvement of pain.  Patient discussed with Dr. Aleen Campi general surgery for admission.  ____________________________________________  FINAL CLINICAL IMPRESSION(S) / ED DIAGNOSES  Final diagnoses:  Intra-abdominal abscess (HCC)  Diverticulitis of large intestine with abscess, unspecified bleeding status     MEDICATIONS GIVEN DURING THIS VISIT:  Medications  piperacillin-tazobactam (ZOSYN) IVPB 3.375 g (has no administration in time range)  morphine 2 MG/ML injection 2 mg (has no administration in time range)  ondansetron (ZOFRAN) injection 4 mg (has no administration in time range)  iohexol (OMNIPAQUE) 350 MG/ML injection 100 mL (100 mLs Intravenous Contrast Given 01/04/20 2113)     ED Discharge Orders    None      *Please note:  Jacob Huerta was evaluated in Emergency Department on 01/04/2020 for the symptoms described in the history of present illness. He was evaluated in the context  of the global COVID-19 pandemic, which necessitated consideration that the patient might be at risk for infection with the SARS-CoV-2 virus that causes COVID-19. Institutional protocols and algorithms that pertain to the evaluation of patients at risk for COVID-19 are in a state of rapid change based on information released by regulatory bodies including the CDC and federal and state organizations. These policies and algorithms were followed during the patient's care in the ED.  Some ED evaluations and interventions may be delayed as a result of limited staffing during the pandemic.*  Note:  This document was prepared using Dragon voice recognition software and may include unintentional dictation errors.   Darci Current, MD 01/04/20 [id number]

## 2020-01-04 NOTE — H&P (Signed)
Date of Admission:  01/04/2020  Reason for Admission:  Acute diverticulitis with abscess  History of Present Illness: Jacob Huerta is a 29 y.o. male presenting with a two day history of abdominal pain in the right lower quadrant.  He reports that the pain started the night of 4/7 around 11 pm.  He thought it was related to constipation and took a laxative but it did not help the pain.  He did end up having multiple bouts of diarrhea that have been ongoing today.  The pain has continued and worsened.  Reports feeling hot and cold, but no true fever noted.  He also has had nausea but no emesis.  The pain is in the right side, and does not radiate.    Of note, he has a history of appendicitis and underwent a laparoscopic appendectomy with Dr. Tonna Boehringer on 09/27/19.  Patient reports that the pain today is worse than back then.  In the ED, his laboratory workup is significant for a WBC of 18.3 with very hemoconcentrated sample, with Hgb of 17.8 and Hct 49.2.  His Cr is normal at 0.80, but his total bilirubin is elevated to 3.5, with AST 57 and ALT 79.  Back on 08/2019, he did have mildly elevated total bili of 1.8 with AST 83 and ALT 104.  He also had a CT scan tonight of abdomen and pelvis which shows a 4.5 x 1.7 x 7.3 cm abscess in the right lower quadrant abutting the cecum, with diverticular disease surrounding and stranding, suspicious for diverticulitis with abscess.  There is no appendiceal stump and given that it's 3 months since his surgery, much less likely to be a complication from that surgery.  Past Medical History: Past Medical History:  Diagnosis Date  . Asthma      Past Surgical History: Past Surgical History:  Procedure Laterality Date  . LAPAROSCOPIC APPENDECTOMY N/A 09/27/2019   Procedure: APPENDECTOMY LAPAROSCOPIC;  Surgeon: Sung Amabile, DO;  Location: [facility] ORS;  Service: General;  Laterality: N/A;  . OTHER SURGICAL HISTORY     jaw surgery, rotator cuff surgery    Home  Medications: Prior to Admission medications   Medication Sig Start Date End Date Taking? Authorizing Provider  albuterol (PROVENTIL HFA;VENTOLIN HFA) 108 (90 BASE) MCG/ACT inhaler Inhale 2 puffs into the lungs every 6 (six) hours as needed. For breathing   Yes [provider]    Allergies: Allergies  Allergen Reactions  . Uncaria Tomentosa (Cats Claw) Itching  . Hydrocodone     Vomiting, itching    Social History:  reports that he has been smoking cigarettes. He has been smoking about 0.25 packs per day. He has never used smokeless tobacco. He reports current alcohol use. He reports that he does not use drugs.   Family History: No family history on file.  Review of Systems: Review of Systems  Constitutional: Positive for chills. Negative for fever.  HENT: Negative for hearing loss.   Respiratory: Negative for shortness of breath.   Cardiovascular: Negative for chest pain.  Gastrointestinal: Positive for abdominal pain, diarrhea and nausea. Negative for constipation and vomiting.  Genitourinary: Negative for dysuria.  Musculoskeletal: Negative for myalgias.  Skin: Negative for rash.  Neurological: Negative for dizziness.  Psychiatric/Behavioral: Negative for depression.    Physical Exam BP (!) 163/87 (BP Location: Left Arm)   Pulse (!) 101   Temp 99.6 F (37.6 C) (Oral)   Resp 16   Ht 5\' 8"  (1.727 m)   Wt 108.9  kg   SpO2 95%   BMI 36.49 kg/m  CONSTITUTIONAL: No acute distress HEENT:  Normocephalic, atraumatic, extraocular motion intact. NECK: Trachea is midline, and there is no jugular venous distension.  RESPIRATORY:  Lungs are clear, and breath sounds are equal bilaterally. Normal respiratory effort without pathologic use of accessory muscles. CARDIOVASCULAR: Heart is regular without murmurs, gallops, or rubs. GI: The abdomen is soft, non-distended, with tenderness to palpation in the right abdomen, particularly right lower quadrant.  No diffuse  peritonitis.  MUSCULOSKELETAL:  Normal muscle strength and tone in all four extremities.  No peripheral edema or cyanosis. SKIN: Skin turgor is normal. There are no pathologic skin lesions.  NEUROLOGIC:  Motor and sensation is grossly normal.  Cranial nerves are grossly intact. PSYCH:  Alert and oriented to person, place and time. Affect is normal.  Laboratory Analysis: Results for orders placed or performed during the hospital encounter of 01/04/20 (from the past 24 hour(s))  Lipase, blood     Status: None   Collection Time: 01/04/20  7:08 PM  Result Value Ref Range   Lipase 14 11 - 51 U/L  Comprehensive metabolic panel     Status: Abnormal   Collection Time: 01/04/20  7:08 PM  Result Value Ref Range   Sodium 135 135 - 145 mmol/L   Potassium 3.4 (L) 3.5 - 5.1 mmol/L   Chloride 102 98 - 111 mmol/L   CO2 22 22 - 32 mmol/L   Glucose, Bld 136 (H) 70 - 99 mg/dL   BUN 11 6 - 20 mg/dL   Creatinine, Ser 8.29 0.61 - 1.24 mg/dL   Calcium 8.9 8.9 - 56.2 mg/dL   Total Protein 7.9 6.5 - 8.1 g/dL   Albumin 4.2 3.5 - 5.0 g/dL   AST 57 (H) 15 - 41 U/L   ALT 73 (H) 0 - 44 U/L   Alkaline Phosphatase 64 38 - 126 U/L   Total Bilirubin 3.5 (H) 0.3 - 1.2 mg/dL   GFR calc non Af Amer >60 >60 mL/min   GFR calc Af Amer >60 >60 mL/min   Anion gap 11 5 - 15  CBC     Status: Abnormal   Collection Time: 01/04/20  7:08 PM  Result Value Ref Range   WBC 18.3 (H) 4.0 - 10.5 K/uL   RBC 5.15 4.22 - 5.81 MIL/uL   Hemoglobin 17.8 (H) 13.0 - 17.0 g/dL   HCT 13.0 86.5 - 78.4 %   MCV 95.5 80.0 - 100.0 fL   MCH 34.6 (H) 26.0 - 34.0 pg   MCHC 36.2 (H) 30.0 - 36.0 g/dL   RDW 69.6 29.5 - 28.4 %   Platelets 191 150 - 400 K/uL   nRBC 0.0 0.0 - 0.2 %  Urinalysis, Complete w Microscopic     Status: Abnormal   Collection Time: 01/04/20  7:08 PM  Result Value Ref Range   Color, Urine AMBER (A) YELLOW   APPearance CLEAR (A) CLEAR   Specific Gravity, Urine 1.027 1.005 - 1.030   pH 6.0 5.0 - 8.0   Glucose, UA  NEGATIVE NEGATIVE mg/dL   Hgb urine dipstick NEGATIVE NEGATIVE   Bilirubin Urine NEGATIVE NEGATIVE   Ketones, ur NEGATIVE NEGATIVE mg/dL   Protein, ur 132 (A) NEGATIVE mg/dL   Nitrite NEGATIVE NEGATIVE   Leukocytes,Ua NEGATIVE NEGATIVE   RBC / HPF 0-5 0 - 5 RBC/hpf   WBC, UA 0-5 0 - 5 WBC/hpf   Bacteria, UA NONE SEEN NONE SEEN   Squamous Epithelial /  LPF NONE SEEN 0 - 5   Mucus PRESENT     Imaging: CT Abdomen Pelvis W Contrast  Result Date: 01/04/2020 CLINICAL DATA:  Lower abdominal pain with diarrhea and leukocytosis. Abdominal tenderness. Appendectomy 3 months ago. EXAM: CT ABDOMEN AND PELVIS WITH CONTRAST TECHNIQUE: Multidetector CT imaging of the abdomen and pelvis was performed using the standard protocol following bolus administration of intravenous contrast. CONTRAST:  OMNIPAQUE IOHEXOL 350 MG/ML SOLN COMPARISON:  Most recent CT 09/26/2019 FINDINGS: Lower chest: Subsegmental atelectasis in both lower lobes. No pleural fluid. Hepatobiliary: Enlarged liver spanning 24 cm cranial caudal. Advanced hepatic steatosis. More geographic areas of decreased hepatic density likely represent focal fatty deposition. Gallbladder physiologically distended, no calcified stone. No biliary dilatation. Pancreas: No ductal dilatation or inflammation. Spleen: Enlarged spanning 16.6 cm cranial caudal. No focal abnormality. Adrenals/Urinary Tract: Normal adrenal glands. No hydronephrosis or perinephric edema. Homogeneous renal enhancement. Small cyst in the medial left kidney is incompletely characterized. Urinary bladder is near completely empty and not well assessed. Stomach/Bowel: Appendectomy 3 months prior. Fat stranding and inflammatory change involves the right lower quadrant and pericolic gutter with small amount of free fluid tracking from the pelvis to the subhepatic space. Multiple diverticula involve the cecum and proximal ascending colon. No free air, however there is a thin car shin tech  peripherally enhancing collection abutting the cecum that is suspicious for abscess measuring 4.5 x 1.7 x 7.3 cm, series 2, image 64. this fluid collection abuts the surgical clips adjacent to the cecum. Distal small bowel loops are dilated and fluid-filled with mild mucosal enhancement, possibly reactive. Proximal small bowel and stomach are normal. Majority of the colon is nondistended, but no additional sites of colonic inflammation. There is liquid stool in the distal colon. Vascular/Lymphatic: Normal caliber abdominal aorta. Multiple small periportal, central mesenteric, and retroperitoneal nodes are likely reactive. Reproductive: Prostate is unremarkable. Other: Inflammatory change in the right lower quadrant with fat stranding and small amount of free fluid. Free fluid tracks into the pelvis and right pericolic gutter in the subhepatic space. Right lower quadrant fluid collection as described above. Small fat containing umbilical hernia. Musculoskeletal: There are no acute or suspicious osseous abnormalities. IMPRESSION: 1. Right lower quadrant pericecal fluid collection measuring 4.5 x 1.7 x 7.3 cm is suspicious for abscess with surrounding inflammatory change. This abuts the appendectomy clips adjacent to the cecum, and is in the region of multiple cecal and ascending colonic diverticula. Given 3 months since prior appendectomy, a postoperative abscess would be unusual, and findings are favored to represent a diverticular abscess rather than postoperative. No appendiceal stump is visualized to suggest stump appendicitis. No free air. 2. Liquid stool in the colon suggesting diarrheal process. 3. Dilated fluid-filled distal small bowel loops with mild mucosal enhancement, possibly reactive. 4. Hepatosplenomegaly and advanced hepatic steatosis. Electronically Signed   By: Narda Rutherford M.D.   On: 01/04/2020 21:43    Assessment and Plan: This is a 29 y.o. male with likely acute diverticulitis of the  cecum with abscess.  Patient will be admitted to surgical team.  He will be NPO with IV fluid hydration and will start on IV Zosyn for his abscess and diverticulitis as well as appropriate pain and nausea medications.  I agree that since his surgery was 3 months ago, this is unlikely to be a complication from his surgery but rather a new process altogether.  Given the size of the abscess, will discuss with IR in the morning for the possibility  of drain placement as well.  Discussed with the patient that if the abscess is not drainable, we would continue IV antibiotics and likely repeat CT scan in a few days to evaluate.  He may need surgery this admission if there is no improvement or if any worsening in his clinical condition.  Patient understands this plan and all of his questions have been answered.  Will discuss with Dr. Lysle Pearl in AM.    Melvyn Neth, MD [facility] Pg:  [phone]

## 2020-01-04 NOTE — ED Triage Notes (Signed)
Patient presents to the ED with abdominal pain since last night.  Patient states last night he thought he was constipated and took a laxative.  Reports today he has had diarrhea x 6.  Patient reports nausea, denies vomiting.  Patient states pain is worse in right lower quadrant.  Patient had appendix removed in January.  Patient's abdomen is tender.

## 2020-01-05 DIAGNOSIS — R945 Abnormal results of liver function studies: Secondary | ICD-10-CM

## 2020-01-05 LAB — CBC WITH DIFFERENTIAL/PLATELET
Abs Immature Granulocytes: 0.06 10*3/uL (ref 0.00–0.07)
Basophils Absolute: 0.1 10*3/uL (ref 0.0–0.1)
Basophils Relative: 0 %
Eosinophils Absolute: 0.2 10*3/uL (ref 0.0–0.5)
Eosinophils Relative: 1 %
HCT: 47.9 % (ref 39.0–52.0)
Hemoglobin: 16.9 g/dL (ref 13.0–17.0)
Immature Granulocytes: 0 %
Lymphocytes Relative: 17 %
Lymphs Abs: 2.2 10*3/uL (ref 0.7–4.0)
MCH: 34 pg (ref 26.0–34.0)
MCHC: 35.3 g/dL (ref 30.0–36.0)
MCV: 96.4 fL (ref 80.0–100.0)
Monocytes Absolute: 0.6 10*3/uL (ref 0.1–1.0)
Monocytes Relative: 5 %
Neutro Abs: 10.5 10*3/uL — ABNORMAL HIGH (ref 1.7–7.7)
Neutrophils Relative %: 77 %
Platelets: 155 10*3/uL (ref 150–400)
RBC: 4.97 MIL/uL (ref 4.22–5.81)
RDW: 12.6 % (ref 11.5–15.5)
WBC: 13.6 10*3/uL — ABNORMAL HIGH (ref 4.0–10.5)
nRBC: 0 % (ref 0.0–0.2)

## 2020-01-05 LAB — IRON AND TIBC
Iron: 20 ug/dL — ABNORMAL LOW (ref 45–182)
Saturation Ratios: 8 % — ABNORMAL LOW (ref 17.9–39.5)
TIBC: 266 ug/dL (ref 250–450)
UIBC: 246 ug/dL

## 2020-01-05 LAB — COMPREHENSIVE METABOLIC PANEL
ALT: 56 U/L — ABNORMAL HIGH (ref 0–44)
AST: 40 U/L (ref 15–41)
Albumin: 3.6 g/dL (ref 3.5–5.0)
Alkaline Phosphatase: 57 U/L (ref 38–126)
Anion gap: 9 (ref 5–15)
BUN: 15 mg/dL (ref 6–20)
CO2: 25 mmol/L (ref 22–32)
Calcium: 8.4 mg/dL — ABNORMAL LOW (ref 8.9–10.3)
Chloride: 102 mmol/L (ref 98–111)
Creatinine, Ser: 0.78 mg/dL (ref 0.61–1.24)
GFR calc Af Amer: >60 mL/min (ref >60)
GFR calc non Af Amer: >60 mL/min (ref >60)
Glucose, Bld: 100 mg/dL — ABNORMAL HIGH (ref 70–99)
Potassium: 3.3 mmol/L — ABNORMAL LOW (ref 3.5–5.1)
Sodium: 136 mmol/L (ref 135–145)
Total Bilirubin: 3.9 mg/dL — ABNORMAL HIGH (ref 0.3–1.2)
Total Protein: 7.2 g/dL (ref 6.5–8.1)

## 2020-01-05 LAB — HEPATITIS B SURFACE ANTIGEN: Hepatitis B Surface Ag: NONREACTIVE

## 2020-01-05 LAB — HEPATITIS A ANTIBODY, TOTAL: hep A Total Ab: NONREACTIVE

## 2020-01-05 LAB — HEPATITIS B CORE ANTIBODY, TOTAL: Hep B Core Total Ab: NONREACTIVE

## 2020-01-05 LAB — MAGNESIUM: Magnesium: 1.5 mg/dL — ABNORMAL LOW (ref 1.7–2.4)

## 2020-01-05 LAB — BILIRUBIN, DIRECT: Bilirubin, Direct: 1.4 mg/dL — ABNORMAL HIGH (ref 0.0–0.2)

## 2020-01-05 MED ORDER — MAGNESIUM SULFATE 2 GM/50ML IV SOLN
2.0000 g | Freq: Once | INTRAVENOUS | Status: AC
  Administered 2020-01-05: 2 g via INTRAVENOUS
  Filled 2020-01-05: qty 50

## 2020-01-05 MED ORDER — POTASSIUM CHLORIDE 10 MEQ/100ML IV SOLN
10.0000 meq | INTRAVENOUS | Status: AC
  Administered 2020-01-05 (×3): 10 meq via INTRAVENOUS
  Filled 2020-01-05 (×3): qty 100

## 2020-01-05 MED ORDER — HYDROMORPHONE HCL 1 MG/ML IJ SOLN
0.5000 mg | INTRAMUSCULAR | Status: DC | PRN
  Administered 2020-01-05 (×3): 0.5 mg via INTRAVENOUS
  Filled 2020-01-05 (×3): qty 1

## 2020-01-05 MED ORDER — HYDROMORPHONE HCL 1 MG/ML IJ SOLN
0.5000 mg | INTRAMUSCULAR | Status: DC | PRN
  Administered 2020-01-05 – 2020-01-09 (×26): 1 mg via INTRAVENOUS
  Filled 2020-01-05 (×27): qty 1

## 2020-01-05 NOTE — Progress Notes (Signed)
Interventional Radiology Progress Note  CT reviewed: Thin RLQ fluid collection is ill-defined and abuts lateral cecum and terminal ileal loops. Would be difficult to drain currently with a catheter. Discussed with Surgery and will hold on intervention currently and plan to re-image with CT after few days of IV antibiotics.  Jodi Marble. Fredia Sorrow, M.D Pager:  [phone]

## 2020-01-05 NOTE — Progress Notes (Signed)
[facility] SURGICAL PROGRESS NOTE (cpt [phone])  Hospital Day(s): 1.   Interval History: Patient seen and examined, no acute events or new complaints overnight. Patient reports he continues to have relatively unchanged abdominal soreness, improved with medication but returns when medication wears off. He denies fever, chills, nausea, or emesis. Leukocytosis improved to 13K (from 18K), mild hypokalemia, and improved hyperbilirubinemia to 1.4 (direct). No new imaging. Has remained NPO.   Review of Systems:  Constitutional: denies fever, chills  HEENT: denies cough or congestion  Respiratory: denies any shortness of breath  Cardiovascular: denies chest pain or palpitations  Gastrointestinal: + abdominal pain, denied N/V, or diarrhea/and bowel function as per interval history Genitourinary: denies burning with urination or urinary frequency   Vital signs in last 24 hours: [min-max] current  Temp:  [99.6 F (37.6 C)] 99.6 F (37.6 C) (04/08 1901) Pulse Rate:  [81-101] 94 (04/09 0630) Resp:  [16] 16 (04/08 1901) BP: (125-163)/(70-87) 125/80 (04/09 0630) SpO2:  [92 %-96 %] 94 % (04/09 0630) Weight:  [108.9 kg] 108.9 kg (04/08 1902)     Height: 5\' 8"  (172.7 cm) Weight: 108.9 kg BMI (Calculated): 36.5   Intake/Output last 2 shifts:  04/08 0701 - 04/09 0700 In: 100 [IV Piggyback:100] Out: -    Physical Exam:  Constitutional: alert, cooperative and no distress  HENT: normocephalic without obvious abnormality  Eyes: PERRL, EOM's grossly intact and symmetric  Respiratory: breathing non-labored at rest  Cardiovascular: regular rate and sinus rhythm  Gastrointestinal: Soft, lower abdominal soreness, and non-distended, no rebound/guarding Musculoskeletal: no edema or wounds, motor and sensation grossly intact, NT    Labs:  CBC Latest Ref Rng & Units 01/05/2020 01/04/2020 09/27/2019  WBC 4.0 - 10.5 K/uL 13.6(H) 18.3(H) 11.9(H)  Hemoglobin 13.0 - 17.0 g/dL 09/29/2019 17.8(H) 14.9   Hematocrit 39.0 - 52.0 % 47.9 49.2 40.6  Platelets 150 - 400 K/uL 155 191 204   CMP Latest Ref Rng & Units 01/05/2020 01/04/2020 09/27/2019  Glucose 70 - 99 mg/dL 09/29/2019) 540(J) 811(B)  BUN 6 - 20 mg/dL 15 11 16   Creatinine 0.61 - 1.24 mg/dL 147(W 2.95  Sodium 135 - 145 mmol/L 136 135 136  Potassium 3.5 - 5.1 mmol/L 3.3(L) 3.4(L) 3.4(L)  Chloride 98 - 111 mmol/L 102 102 104  CO2 22 - 32 mmol/L 25 22 21(L)  Calcium 8.9 - 10.3 mg/dL 6.21) 8.9 3.08)  Total Protein 6.5 - 8.1 g/dL 7.2 7.9 -  Total Bilirubin 0.3 - 1.2 mg/dL 3.9(H) 3.5(H) -  Alkaline Phos 38 - 126 U/L 57 64 -  AST 15 - 41 U/L 40 57(H) -  ALT 0 - 44 U/L 56(H) 73(H) -     Imaging studies: No new pertinent imaging studies   Assessment/Plan: (ICD-10's: K83.20) 29 y.o. male with persistent abdominal pain but otherwise improved leukocytosis attributable to complicated acute diverticulitis with small abscess    - Remain NPO  - Continue IVF resuscitation  - Continue IV Abx (Zosyn)  - Discussed with IR this morning who reviewed CT scan. Abscess is still very small and not well defined. For now we will hold of on drain placement. We will likely repeat CT Abdomen/Pelvis in 48-72 hours  - Pain control prn; antiemetics prn  - Monitor abdominal examination  - Will discuss with GI for hyperbilirubinemia  - trend labs  - no emergent surgical intervention  - medical management of comorbid conditions  - DVT prophylaxis   All of the above findings and recommendations were  discussed with the patient, and the medical team, and all of patient's questions were answered to his expressed satisfaction.  -- Edison Simon, PA-C [facility] 01/05/2020, 8:10 AM [phone] M-F: 7am - 4pm

## 2020-01-05 NOTE — Consult Note (Signed)
[name] , MD [address], [place], [place], [address] [address], [address], [place], [place], [address] Phone: [phone]  Fax: [phone]  Consultation  Referring Provider:    Dr Aleen Campi  Primary Care Physician:  Georgann Housekeeper, MD Primary Gastroenterologist:  None         Reason for Consultation:     Abnormal LFTs  Date of Admission:  01/04/2020 Date of Consultation:  01/05/2020         HPI:   Jacob Huerta is a 29 y.o. male presented to the hospital with abdominal pain in the right lower quadrant.  History of appendicitis underwent a laparoscopic appendectomy in December 2020.  In the ER was noted to have leukocytosis abnormal liver function tests. CT scan of the abdomen demonstrated right lower quadrant pericecal fluid collection 4.5 x 1.7 x 7.3 cm suspicious for abscess with surrounding inflammatory changes.  Concern for diverticular abscess.  Hepatic steatosis noted.  No biliary dilation noted.   Albumin is 3.6, AST 40 and ALT 56.  Total bilirubin is 3.9.  Direct bilirubin is 1.4.  In December 2020 his total bilirubin was also 1.8.  He states that he drinks a couple of beers daily along with possibly quarter to half a bottle of rum for many years.  Its diet is also very rich in fatty foods.  He does have tattoos.  No Financial planner.  No illegal drug use.  He states that he has had elevated liver enzymes in the past but was due to taking in excess of ibuprofen which he then stopped and his liver tests returned normal.  Recently he has been taking ibuprofen on a regular basis. Past Medical History:  Diagnosis Date  . Asthma     Past Surgical History:  Procedure Laterality Date  . LAPAROSCOPIC APPENDECTOMY N/A 09/27/2019   Procedure: APPENDECTOMY LAPAROSCOPIC;  Surgeon: Sung Amabile, DO;  Location: [facility] ORS;  Service: General;  Laterality: N/A;  . OTHER SURGICAL HISTORY     jaw surgery, rotator cuff surgery    Prior to Admission medications     Medication Sig Start Date End Date Taking? Authorizing Provider  albuterol (PROVENTIL HFA;VENTOLIN HFA) 108 (90 BASE) MCG/ACT inhaler Inhale 2 puffs into the lungs every 6 (six) hours as needed. For breathing   Yes [provider]    No family history on file.   Social History   Tobacco Use  . Smoking status: Current Every Day Smoker    Packs/day: 0.25    Types: Cigarettes  . Smokeless tobacco: Never Used  Substance Use Topics  . Alcohol use: Yes    Comment: occ  . Drug use: No    Allergies as of 01/04/2020 - Review Complete 01/04/2020  Allergen Reaction Noted  . Uncaria tomentosa (cats claw) Itching 10/05/2019  . Hydrocodone  03/08/2012    Review of Systems:    All systems reviewed and negative except where noted in HPI.   Physical Exam:  Vital signs in last 24 hours: Temp:  [98.9 F (37.2 C)-99.6 F (37.6 C)] 98.9 F (37.2 C) (04/09 1018) Pulse Rate:  [81-101] 93 (04/09 1018) Resp:  [16] 16 (04/09 1018) BP: (125-163)/(70-87) 128/79 (04/09 1018) SpO2:  [92 %-96 %] 94 % (04/09 1018) Weight:  [108.9 kg] 108.9 kg (04/08 1902) Last BM Date: 01/05/20 General:   Pleasant, cooperative in NAD Head:  Normocephalic and atraumatic. Eyes:   No icterus.   Conjunctiva pink. PERRLA. Ears:  Normal auditory acuity.  Neck:  Supple; no masses or thyroidomegaly Lungs: Respirations even and unlabored. Lungs clear to auscultation bilaterally.   No wheezes, crackles, or rhonchi.  Heart:  Regular rate and rhythm;  Without murmur, clicks, rubs or gallops Abdomen:  Soft, patient very reluctant for me to palpate deeply.  He seems uncomfortable on general palpation. Neurologic:  Alert and oriented x3;  grossly normal neurologically. Skin:  Intact without significant lesions or rashes. Cervical Nodes:  No significant cervical adenopathy. Psych:  Alert and cooperative. Normal affect.  LAB RESULTS: Recent Labs    01/04/20 1908 01/05/20 0521  WBC 18.3* 13.6*  HGB 17.8* 16.9   HCT 49.2 47.9  PLT 191 155   BMET Recent Labs    01/04/20 1908 01/05/20 0521  NA 135 136  K 3.4* 3.3*  CL 102 102  CO2 22 25  GLUCOSE 136* 100*  BUN 11 15  CREATININE 0.80 0.78  CALCIUM 8.9 8.4*   LFT Recent Labs    01/05/20 0521  PROT 7.2  ALBUMIN 3.6  AST 40  ALT 56*  ALKPHOS 57  BILITOT 3.9*  BILIDIR 1.4*   PT/INR No results for input(s): LABPROT, INR in the last 72 hours.  STUDIES: CT Abdomen Pelvis W Contrast  Result Date: 01/04/2020 CLINICAL DATA:  Lower abdominal pain with diarrhea and leukocytosis. Abdominal tenderness. Appendectomy 3 months ago. EXAM: CT ABDOMEN AND PELVIS WITH CONTRAST TECHNIQUE: Multidetector CT imaging of the abdomen and pelvis was performed using the standard protocol following bolus administration of intravenous contrast. CONTRAST:  OMNIPAQUE IOHEXOL 350 MG/ML SOLN COMPARISON:  Most recent CT 09/26/2019 FINDINGS: Lower chest: Subsegmental atelectasis in both lower lobes. No pleural fluid. Hepatobiliary: Enlarged liver spanning 24 cm cranial caudal. Advanced hepatic steatosis. More geographic areas of decreased hepatic density likely represent focal fatty deposition. Gallbladder physiologically distended, no calcified stone. No biliary dilatation. Pancreas: No ductal dilatation or inflammation. Spleen: Enlarged spanning 16.6 cm cranial caudal. No focal abnormality. Adrenals/Urinary Tract: Normal adrenal glands. No hydronephrosis or perinephric edema. Homogeneous renal enhancement. Small cyst in the medial left kidney is incompletely characterized. Urinary bladder is near completely empty and not well assessed. Stomach/Bowel: Appendectomy 3 months prior. Fat stranding and inflammatory change involves the right lower quadrant and pericolic gutter with small amount of free fluid tracking from the pelvis to the subhepatic space. Multiple diverticula involve the cecum and proximal ascending colon. No free air, however there is a thin car shin tech  peripherally enhancing collection abutting the cecum that is suspicious for abscess measuring 4.5 x 1.7 x 7.3 cm, series 2, image 64. this fluid collection abuts the surgical clips adjacent to the cecum. Distal small bowel loops are dilated and fluid-filled with mild mucosal enhancement, possibly reactive. Proximal small bowel and stomach are normal. Majority of the colon is nondistended, but no additional sites of colonic inflammation. There is liquid stool in the distal colon. Vascular/Lymphatic: Normal caliber abdominal aorta. Multiple small periportal, central mesenteric, and retroperitoneal nodes are likely reactive. Reproductive: Prostate is unremarkable. Other: Inflammatory change in the right lower quadrant with fat stranding and small amount of free fluid. Free fluid tracks into the pelvis and right pericolic gutter in the subhepatic space. Right lower quadrant fluid collection as described above. Small fat containing umbilical hernia. Musculoskeletal: There are no acute or suspicious osseous abnormalities. IMPRESSION: 1. Right lower quadrant pericecal fluid collection measuring 4.5 x 1.7 x 7.3 cm is suspicious for abscess with surrounding inflammatory change. This abuts the appendectomy clips adjacent to the  cecum, and is in the region of multiple cecal and ascending colonic diverticula. Given 3 months since prior appendectomy, a postoperative abscess would be unusual, and findings are favored to represent a diverticular abscess rather than postoperative. No appendiceal stump is visualized to suggest stump appendicitis. No free air. 2. Liquid stool in the colon suggesting diarrheal process. 3. Dilated fluid-filled distal small bowel loops with mild mucosal enhancement, possibly reactive. 4. Hepatosplenomegaly and advanced hepatic steatosis. Electronically Signed   By: Keith Rake M.D.   On: 01/04/2020 21:43      Impression / Plan:   Jacob Huerta is a 29 y.o. y/o male admitted with abdominal  pain and found to have an abscess in the right lower quadrant.  I have been consulted for elevated total bilirubin.  It is a combination of direct and indirect hyperbilirubinemia with the significant portion being indirect.  He has hepatic steatosis noted on CT scan.  No biliary dilation noted on CT scan of the abdomen.  No elevation in alkaline phosphatase.  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  The pretest probability of a stone in the common bile duct is low in view of normal size with common bile duct as well as total bilirubin less than 4.  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  In addition may have a component of nonalcoholic fatty liver disease as well.  Plan  1.  Treat the underlying sepsis.  Would expect the bilirubin levels to drop.  If there is still concern for stone in the common bile duct the next up would be to get an MRCP.  2.  Mild elevation in AST and ALT.  At some point he would need complete autoimmune and viral hepatitis work-up.  I will order the same but probably most results will not be back for a few days.  His transaminases may also be elevated to chronic alcohol use.  I have strongly suggested him to stop drinking all alcohol.  I would suggest he follow-up for fatty liver disease as an outpatient.  Thank you for involving me in the care of this patient.      LOS: 1 day   Jonathon Bellows, MD  01/05/2020, 1:51 PM

## 2020-01-05 NOTE — ED Notes (Signed)
Messaged surgery regarding persistent pain, surgeon to order another PRN

## 2020-01-06 DIAGNOSIS — R748 Abnormal levels of other serum enzymes: Secondary | ICD-10-CM

## 2020-01-06 LAB — HEPATITIS B E ANTIGEN: Hep B E Ag: NEGATIVE

## 2020-01-06 LAB — CBC
HCT: 41.1 % (ref 39.0–52.0)
Hemoglobin: 14.5 g/dL (ref 13.0–17.0)
MCH: 34 pg (ref 26.0–34.0)
MCHC: 35.3 g/dL (ref 30.0–36.0)
MCV: 96.5 fL (ref 80.0–100.0)
Platelets: 137 10*3/uL — ABNORMAL LOW (ref 150–400)
RBC: 4.26 MIL/uL (ref 4.22–5.81)
RDW: 12.5 % (ref 11.5–15.5)
WBC: 12 10*3/uL — ABNORMAL HIGH (ref 4.0–10.5)
nRBC: 0 % (ref 0.0–0.2)

## 2020-01-06 LAB — SARS CORONAVIRUS 2 (TAT 6-24 HRS): SARS Coronavirus 2: NEGATIVE

## 2020-01-06 LAB — CERULOPLASMIN: Ceruloplasmin: 22.6 mg/dL (ref 16.0–31.0)

## 2020-01-06 LAB — HEPATIC FUNCTION PANEL
ALT: 30 U/L (ref 0–44)
AST: 25 U/L (ref 15–41)
Albumin: 3.2 g/dL — ABNORMAL LOW (ref 3.5–5.0)
Alkaline Phosphatase: 59 U/L (ref 38–126)
Bilirubin, Direct: 1.2 mg/dL — ABNORMAL HIGH (ref 0.0–0.2)
Indirect Bilirubin: 1.9 mg/dL — ABNORMAL HIGH (ref 0.3–0.9)
Total Bilirubin: 3.1 mg/dL — ABNORMAL HIGH (ref 0.3–1.2)
Total Protein: 6.7 g/dL (ref 6.5–8.1)

## 2020-01-06 LAB — BASIC METABOLIC PANEL
Anion gap: 10 (ref 5–15)
BUN: 14 mg/dL (ref 6–20)
CO2: 23 mmol/L (ref 22–32)
Calcium: 8.2 mg/dL — ABNORMAL LOW (ref 8.9–10.3)
Chloride: 104 mmol/L (ref 98–111)
Creatinine, Ser: 0.69 mg/dL (ref 0.61–1.24)
GFR calc Af Amer: >60 mL/min (ref >60)
GFR calc non Af Amer: >60 mL/min (ref >60)
Glucose, Bld: 94 mg/dL (ref 70–99)
Potassium: 3.3 mmol/L — ABNORMAL LOW (ref 3.5–5.1)
Sodium: 137 mmol/L (ref 135–145)

## 2020-01-06 LAB — ANTI-SMOOTH MUSCLE ANTIBODY, IGG: F-Actin IgG: 4 Units (ref 0–19)

## 2020-01-06 LAB — MAGNESIUM: Magnesium: 2.1 mg/dL (ref 1.7–2.4)

## 2020-01-06 LAB — HEPATITIS C ANTIBODY: HCV Ab: NONREACTIVE

## 2020-01-06 LAB — MITOCHONDRIAL ANTIBODIES: Mitochondrial M2 Ab, IgG: <20 Units (ref 0.0–20.0)

## 2020-01-06 LAB — ANA: Anti Nuclear Antibody (ANA): POSITIVE — AB

## 2020-01-06 LAB — HEPATITIS B CORE ANTIBODY, IGM: Hep B C IgM: NONREACTIVE

## 2020-01-06 LAB — HEPATITIS B SURFACE ANTIBODY, QUANTITATIVE: Hep B S AB Quant (Post): <3.1 m[IU]/mL — ABNORMAL LOW (ref >9.9)

## 2020-01-06 LAB — HEPATITIS A ANTIBODY, IGM: Hep A IgM: NONREACTIVE

## 2020-01-06 NOTE — Progress Notes (Signed)
CC: Cecal diverticulitis with abscess Subjective: He continues to have intermittent abdominal pain.  His white count today was 12,000 that is a slightly better.  Did have low-grade temperature of 100.1.  No emesis.  Objective: Vital signs in last 24 hours: Temp:  [98.6 F (37 C)-100.1 F (37.8 C)] 99.8 F (37.7 C) (04/10 1220) Pulse Rate:  [98-116] 108 (04/10 1220) Resp:  [16-20] 19 (04/10 1220) BP: (135-157)/(81-90) 157/88 (04/10 1220) SpO2:  [93 %-95 %] 95 % (04/10 1220) Last BM Date: 01/04/20  Intake/Output from previous day: 04/09 0701 - 04/10 0700 In: 2052.5 [I.V.:2000; IV Piggyback:52.5] Out: 200 [Urine:200] Intake/Output this shift: Total I/O In: 2159.7 [I.V.:2067.7; IV Piggyback:92.1] Out: 500 [Urine:500]  Physical exam:  NAd, alert Abd: soft, mild TTP RLQ, no peritonitis Ext: no edema well perfused  Lab Results: CBC  Recent Labs    01/05/20 0521 01/06/20 0634  WBC 13.6* 12.0*  HGB 16.9 14.5  HCT 47.9 41.1  PLT 155 137*   BMET Recent Labs    01/05/20 0521 01/06/20 0634  NA 136 137  K 3.3* 3.3*  CL 102 104  CO2 25 23  GLUCOSE 100* 94  BUN 15 14  CREATININE 0.78 0.69  CALCIUM 8.4* 8.2*   PT/INR No results for input(s): LABPROT, INR in the last 72 hours. ABG No results for input(s): PHART, HCO3 in the last 72 hours.  Invalid input(s): PCO2, PO2  Studies/Results: CT Abdomen Pelvis W Contrast  Result Date: 01/04/2020 CLINICAL DATA:  Lower abdominal pain with diarrhea and leukocytosis. Abdominal tenderness. Appendectomy 3 months ago. EXAM: CT ABDOMEN AND PELVIS WITH CONTRAST TECHNIQUE: Multidetector CT imaging of the abdomen and pelvis was performed using the standard protocol following bolus administration of intravenous contrast. CONTRAST:  OMNIPAQUE IOHEXOL 350 MG/ML SOLN COMPARISON:  Most recent CT 09/26/2019 FINDINGS: Lower chest: Subsegmental atelectasis in both lower lobes. No pleural fluid. Hepatobiliary: Enlarged liver spanning 24 cm  cranial caudal. Advanced hepatic steatosis. More geographic areas of decreased hepatic density likely represent focal fatty deposition. Gallbladder physiologically distended, no calcified stone. No biliary dilatation. Pancreas: No ductal dilatation or inflammation. Spleen: Enlarged spanning 16.6 cm cranial caudal. No focal abnormality. Adrenals/Urinary Tract: Normal adrenal glands. No hydronephrosis or perinephric edema. Homogeneous renal enhancement. Small cyst in the medial left kidney is incompletely characterized. Urinary bladder is near completely empty and not well assessed. Stomach/Bowel: Appendectomy 3 months prior. Fat stranding and inflammatory change involves the right lower quadrant and pericolic gutter with small amount of free fluid tracking from the pelvis to the subhepatic space. Multiple diverticula involve the cecum and proximal ascending colon. No free air, however there is a thin car shin tech peripherally enhancing collection abutting the cecum that is suspicious for abscess measuring 4.5 x 1.7 x 7.3 cm, series 2, image 64. this fluid collection abuts the surgical clips adjacent to the cecum. Distal small bowel loops are dilated and fluid-filled with mild mucosal enhancement, possibly reactive. Proximal small bowel and stomach are normal. Majority of the colon is nondistended, but no additional sites of colonic inflammation. There is liquid stool in the distal colon. Vascular/Lymphatic: Normal caliber abdominal aorta. Multiple small periportal, central mesenteric, and retroperitoneal nodes are likely reactive. Reproductive: Prostate is unremarkable. Other: Inflammatory change in the right lower quadrant with fat stranding and small amount of free fluid. Free fluid tracks into the pelvis and right pericolic gutter in the subhepatic space. Right lower quadrant fluid collection as described above. Small fat containing umbilical hernia. Musculoskeletal: There are  no acute or suspicious osseous  abnormalities. IMPRESSION: 1. Right lower quadrant pericecal fluid collection measuring 4.5 x 1.7 x 7.3 cm is suspicious for abscess with surrounding inflammatory change. This abuts the appendectomy clips adjacent to the cecum, and is in the region of multiple cecal and ascending colonic diverticula. Given 3 months since prior appendectomy, a postoperative abscess would be unusual, and findings are favored to represent a diverticular abscess rather than postoperative. No appendiceal stump is visualized to suggest stump appendicitis. No free air. 2. Liquid stool in the colon suggesting diarrheal process. 3. Dilated fluid-filled distal small bowel loops with mild mucosal enhancement, possibly reactive. 4. Hepatosplenomegaly and advanced hepatic steatosis. Electronically Signed   By: Keith Rake M.D.   On: 01/04/2020 21:43    Anti-infectives: Anti-infectives (From admission, onward)   Start     Dose/Rate Route Frequency Ordered Stop   01/05/20 0600  piperacillin-tazobactam (ZOSYN) IVPB 3.375 g     3.375 g 12.5 mL/hr over 240 Minutes Intravenous Every 8 hours 01/04/20 2300     01/04/20 2215  piperacillin-tazobactam (ZOSYN) IVPB 3.375 g     3.375 g 100 mL/hr over 30 Minutes Intravenous  Once 01/04/20 2205 01/04/20 2302      Assessment/Plan:  Centimeters diverticulitis of the right colon.  Or will obtain another CT scan in the morning to follow-up the collection.  He has ecstatic needed.  No completely better but definitely not worse.  No need for emergent surgical intervention at this time. Please note that I spent at least 25 minutes in this encounter with greater than 50% spent in coordination counseling of his care  Caroleen Hamman, MD, [facility]  01/06/2020

## 2020-01-06 NOTE — Progress Notes (Signed)
Jacob Lame, MD [facility]   [address] [place], [place] [address] Phone: [phone] Fax : [phone]   Subjective: The patient states that his abdominal pain is much better today and only localized to the left side when it was on the left side and the right side of the past.  His liver enzymes have come down slightly since yesterday.  The AST and ALT have returned to normal and the bilirubin is slightly down.   Objective: Vital signs in last 24 hours: Vitals:   01/05/20 1738 01/05/20 2354 01/06/20 0521 01/06/20 1220  BP: 136/88 135/81 139/90 (!) 157/88  Pulse: (!) 105 98 (!) 116 (!) 108  Resp: 20 18 16 19   Temp: 99.5 F (37.5 C) 98.6 F (37 C) 100.1 F (37.8 C) 99.8 F (37.7 C)  TempSrc: Oral Oral Oral Oral  SpO2: 95% 94% 93% 95%  Weight:      Height:       Weight change:   Intake/Output Summary (Last 24 hours) at 01/06/2020 1410 Last data filed at 01/06/2020 1240 Gross per 24 hour  Intake 3132.95 ml  Output 575 ml  Net 2557.95 ml     Exam: Heart:: Regular rate and rhythm, S1S2 present or without murmur or extra heart sounds Lungs: normal and clear to auscultation and percussion Abdomen: soft, mildly tender in the left lower quadrant, normal bowel sounds   Lab Results: @LABTEST2 @ Micro Results: No results found for this or any previous visit (from the past 240 hour(s)). Studies/Results: CT Abdomen Pelvis W Contrast  Result Date: 01/04/2020 CLINICAL DATA:  Lower abdominal pain with diarrhea and leukocytosis. Abdominal tenderness. Appendectomy 3 months ago. EXAM: CT ABDOMEN AND PELVIS WITH CONTRAST TECHNIQUE: Multidetector CT imaging of the abdomen and pelvis was performed using the standard protocol following bolus administration of intravenous contrast. CONTRAST:  175mL OMNIPAQUE IOHEXOL 350 MG/ML SOLN COMPARISON:  Most recent CT 09/26/2019 FINDINGS: Lower chest: Subsegmental atelectasis in both lower lobes. No pleural fluid. Hepatobiliary: Enlarged liver  spanning 24 cm cranial caudal. Advanced hepatic steatosis. More geographic areas of decreased hepatic density likely represent focal fatty deposition. Gallbladder physiologically distended, no calcified stone. No biliary dilatation. Pancreas: No ductal dilatation or inflammation. Spleen: Enlarged spanning 16.6 cm cranial caudal. No focal abnormality. Adrenals/Urinary Tract: Normal adrenal glands. No hydronephrosis or perinephric edema. Homogeneous renal enhancement. Small cyst in the medial left kidney is incompletely characterized. Urinary bladder is near completely empty and not well assessed. Stomach/Bowel: Appendectomy 3 months prior. Fat stranding and inflammatory change involves the right lower quadrant and pericolic gutter with small amount of free fluid tracking from the pelvis to the subhepatic space. Multiple diverticula involve the cecum and proximal ascending colon. No free air, however there is a thin car shin tech peripherally enhancing collection abutting the cecum that is suspicious for abscess measuring 4.5 x 1.7 x 7.3 cm, series 2, image 64. this fluid collection abuts the surgical clips adjacent to the cecum. Distal small bowel loops are dilated and fluid-filled with mild mucosal enhancement, possibly reactive. Proximal small bowel and stomach are normal. Majority of the colon is nondistended, but no additional sites of colonic inflammation. There is liquid stool in the distal colon. Vascular/Lymphatic: Normal caliber abdominal aorta. Multiple small periportal, central mesenteric, and retroperitoneal nodes are likely reactive. Reproductive: Prostate is unremarkable. Other: Inflammatory change in the right lower quadrant with fat stranding and small amount of free fluid. Free fluid tracks into the pelvis and right pericolic gutter in the subhepatic space. Right  lower quadrant fluid collection as described above. Small fat containing umbilical hernia. Musculoskeletal: There are no acute or  suspicious osseous abnormalities. IMPRESSION: 1. Right lower quadrant pericecal fluid collection measuring 4.5 x 1.7 x 7.3 cm is suspicious for abscess with surrounding inflammatory change. This abuts the appendectomy clips adjacent to the cecum, and is in the region of multiple cecal and ascending colonic diverticula. Given 3 months since prior appendectomy, a postoperative abscess would be unusual, and findings are favored to represent a diverticular abscess rather than postoperative. No appendiceal stump is visualized to suggest stump appendicitis. No free air. 2. Liquid stool in the colon suggesting diarrheal process. 3. Dilated fluid-filled distal small bowel loops with mild mucosal enhancement, possibly reactive. 4. Hepatosplenomegaly and advanced hepatic steatosis. Electronically Signed   By: Narda Rutherford M.D.   On: 01/04/2020 21:43   Medications: I have reviewed the patient's current medications. Scheduled Meds: . enoxaparin (LOVENOX) injection  40 mg Subcutaneous Q24H  . ketorolac  30 mg Intravenous Q6H  . pantoprazole (PROTONIX) IV  40 mg Intravenous QHS   Continuous Infusions: . lactated ringers 125 mL/hr (01/06/20 1240)  . piperacillin-tazobactam (ZOSYN)  IV Stopped (01/06/20 0807)   PRN Meds:.albuterol, HYDROmorphone (DILAUDID) injection, ondansetron **OR** ondansetron (ZOFRAN) IV   Assessment: Active Problems:   Diverticulitis of large intestine with abscess without bleeding Abnormal liver enzymes Right lower quadrant pericecal fluid collection   Plan: This patient is being seen for abnormal liver enzymes and has been encouraged to stop his alcohol use.  He was also been told that infections can also cause abnormal liver enzymes.  The patient AST and ALT are back to normal and his bilirubin has started to come down.  Nothing further to do from a GI point of view and I will continue to monitor liver enzymes with the hospital service.   LOS: 2 days   Midge Minium 01/06/2020,  2:10 PM Pager [phone] 7am-5pm  Check AMION for 5pm -7am coverage and on weekends

## 2020-01-07 ENCOUNTER — Encounter: Payer: Self-pay | Admitting: Surgery

## 2020-01-07 ENCOUNTER — Inpatient Hospital Stay: Payer: Self-pay

## 2020-01-07 LAB — CBC
HCT: 38.9 % — ABNORMAL LOW (ref 39.0–52.0)
Hemoglobin: 13.8 g/dL (ref 13.0–17.0)
MCH: 33.9 pg (ref 26.0–34.0)
MCHC: 35.5 g/dL (ref 30.0–36.0)
MCV: 95.6 fL (ref 80.0–100.0)
Platelets: 149 K/uL — ABNORMAL LOW (ref 150–400)
RBC: 4.07 MIL/uL — ABNORMAL LOW (ref 4.22–5.81)
RDW: 12.2 % (ref 11.5–15.5)
WBC: 9.7 K/uL (ref 4.0–10.5)
nRBC: 0 % (ref 0.0–0.2)

## 2020-01-07 MED ORDER — IOHEXOL 300 MG/ML  SOLN
125.0000 mL | Freq: Once | INTRAMUSCULAR | Status: AC | PRN
  Administered 2020-01-07: 125 mL via INTRAVENOUS

## 2020-01-07 MED ORDER — ACETAMINOPHEN 325 MG PO TABS
650.0000 mg | ORAL_TABLET | Freq: Four times a day (QID) | ORAL | Status: DC | PRN
  Administered 2020-01-07 – 2020-01-09 (×4): 650 mg via ORAL
  Filled 2020-01-07 (×4): qty 2

## 2020-01-07 MED ORDER — ALBUMIN HUMAN 25 % IV SOLN
12.5000 g | Freq: Once | INTRAVENOUS | Status: AC
  Administered 2020-01-07: 12.5 g via INTRAVENOUS
  Filled 2020-01-07: qty 50

## 2020-01-07 MED ORDER — IOHEXOL 9 MG/ML PO SOLN
500.0000 mL | ORAL | Status: AC
  Administered 2020-01-07 (×2): 500 mL via ORAL

## 2020-01-07 MED ORDER — SODIUM CHLORIDE 0.9 % IV BOLUS
1000.0000 mL | Freq: Once | INTRAVENOUS | Status: AC
  Administered 2020-01-07: 18:00:00 1000 mL via INTRAVENOUS

## 2020-01-07 MED ORDER — SODIUM CHLORIDE 0.9 % IV SOLN
INTRAVENOUS | Status: DC | PRN
  Administered 2020-01-07 – 2020-01-09 (×3): 250 mL via INTRAVENOUS

## 2020-01-07 NOTE — Progress Notes (Signed)
Midge Minium, MD [facility]   [address] [place], [place] [address] Phone: [phone] Fax : [phone]   Subjective: The patient continues to have some abdominal pain but he does state it is better than yesterday.  He also reports that he is going for drainage of his abscess tomorrow.  The patient has reiterated that he is feeling better and has started to drink plenty of fluids.   Objective: Vital signs in last 24 hours: Vitals:   01/07/20 0533 01/07/20 1251 01/07/20 1542 01/07/20 1545  BP: 135/81 (!) 147/92  (!) 142/92  Pulse: (!) 101 (!) 103  84  Resp: 17 20  16   Temp: 99.9 F (37.7 C) (!) 101.6 F (38.7 C) 98.3 F (36.8 C) 98.5 F (36.9 C)  TempSrc: Oral Oral Oral Oral  SpO2: 93% 93%  92%  Weight:      Height:       Weight change:   Intake/Output Summary (Last 24 hours) at 01/07/2020 1554 Last data filed at 01/07/2020 1351 Gross per 24 hour  Intake 3169.81 ml  Output 1825 ml  Net 1344.81 ml     Exam: General : No apparent distress alert and oriented x3 Extremities: Without cyanosis clubbing or edema  Lab Results: @LABTEST2 @ Micro Results: Recent Results (from the past 240 hour(s))  SARS CORONAVIRUS 2 (TAT 6-24 HRS) Nasopharyngeal Nasopharyngeal Swab     Status: None   Collection Time: 01/05/20  8:01 AM   Specimen: Nasopharyngeal Swab  Result Value Ref Range Status   SARS Coronavirus 2 NEGATIVE NEGATIVE Final    Comment: (NOTE) SARS-CoV-2 target nucleic acids are NOT DETECTED. The SARS-CoV-2 RNA is generally detectable in upper and lower respiratory specimens during the acute phase of infection. Negative results do not preclude SARS-CoV-2 infection, do not rule out co-infections with other pathogens, and should not be used as the sole basis for treatment or other patient management decisions. Negative results must be combined with clinical observations, patient history, and epidemiological information. The expected result is Negative. Fact  Sheet for Patients: Fact Sheet for Healthcare Providers: 03/06/20 This test is not yet approved or cleared by the [employer] FDA and  has been authorized for detection and/or diagnosis of SARS-CoV-2 by FDA under an Emergency Use Authorization (EUA). This EUA will remain  in effect (meaning this test can be used) for the duration of the COVID-19 declaration under Section 56 4(b)(1) of the Act, 21 U.S.C. section 360bbb-3(b)(1), unless the authorization is terminated or revoked sooner. Performed at [facility], [address] [place], [address] [place]    Studies/Results: CT ABDOMEN PELVIS W CONTRAST  Result Date: 01/07/2020 CLINICAL DATA:  Right lower quadrant pain. Recent appendectomy December 2020. pericecal abscess noted on prior scan. EXAM: CT ABDOMEN AND PELVIS WITH CONTRAST TECHNIQUE: Multidetector CT imaging of the abdomen and pelvis was performed using the standard protocol following bolus administration of intravenous contrast. CONTRAST:  03/08/2020 OMNIPAQUE IOHEXOL 300 MG/ML  SOLN COMPARISON:  01/04/2020 FINDINGS: Lower chest: Progressive subsegmental atelectasis/consolidation posteriorly at the right lung base. No pleural or pericardial effusion. Hepatobiliary: Fatty liver without focal lesion. Gallbladder nondistended, unremarkable. No biliary ductal dilatation. Pancreas: Unremarkable. No pancreatic ductal dilatation or surrounding inflammatory changes. Spleen: Splenomegaly, 18.8 cm craniocaudal length, without focal lesion. Adrenals/Urinary Tract: Adrenal glands unremarkable. Kidneys enhance normally without hydronephrosis or evident urolithiasis. Urinary bladder is incompletely distended. Stomach/Bowel: Stomach is nondistended. Small bowel decompressed. Terminal ileum unremarkable. Lipomatous ileocecal valve. The colon is nondilated with multiple diverticula  throughout. Vascular/Lymphatic: No vascular  pathology identified. Subcentimeter left para-aortic and aortocaval lymph nodes probably reactive. Reproductive: Prostate is unremarkable. Other: Persistent multiloculated fluid collection inferior and lateral to the cecum, probably slightly increased, no longer containing gas. Components measure up to 4.7 cm transverse, 6.9 cm craniocaudal. Persistent surrounding inflammatory/edematous changes. There is a new 2.9 cm loculated collection in the anterior inferior right lower quadrant. Trace pelvic ascites. No free air. Musculoskeletal: Degenerative disc disease L4-S1. No fracture or worrisome bone lesion. IMPRESSION: 1. Some interval worsening of multiloculated abscess, right lower quadrant adjacent to the cecum. Electronically Signed   By: Lucrezia Europe M.D.   On: 01/07/2020 12:08   Medications: I have reviewed the patient's current medications. Scheduled Meds: . ketorolac  30 mg Intravenous Q6H  . pantoprazole (PROTONIX) IV  40 mg Intravenous QHS   Continuous Infusions: . lactated ringers 125 mL/hr (01/07/20 1133)  . piperacillin-tazobactam (ZOSYN)  IV 3.375 g (01/07/20 1328)   PRN Meds:.acetaminophen, albuterol, HYDROmorphone (DILAUDID) injection, ondansetron **OR** ondansetron (ZOFRAN) IV   Assessment: Active Problems:   Diverticulitis of large intestine with abscess without bleeding   Abnormal liver enzymes    Plan: The patient has had abnormal liver enzymes with history of alcohol abuse and no labs were sent for his liver today.  The patient will have labs tomorrow for his liver enzymes and if they continue to go down we will likely sign off the patient and have him follow-up as an outpatient.  The patient has been told and agrees with stopping alcohol use.   LOS: 3 days   Lucilla Lame 01/07/2020, 3:54 PM Pager [phone] 7am-5pm  Check AMION for 5pm -7am coverage and on weekends

## 2020-01-07 NOTE — Progress Notes (Signed)
CC: Diverticular abscess Subjective: Patient feels okay.  He did have a fever.  We repeated his CT scan this morning and I have personally reviewed and discussed it with interventional radiology seems to be an increase in the abscesses there is an additional small abscess inferior to the cecum.  Do think that there is a good window for interventional to perform percutaneous drain.  Objective: Vital signs in last 24 hours: Temp:  [98.3 F (36.8 C)-101.6 F (38.7 C)] 98.5 F (36.9 C) (04/11 1545) Pulse Rate:  [84-103] 84 (04/11 1545) Resp:  [16-20] 16 (04/11 1545) BP: (126-147)/(81-92) 142/92 (04/11 1545) SpO2:  [92 %-93 %] 92 % (04/11 1545) Last BM Date: 01/04/20  Intake/Output from previous day: 04/10 0701 - 04/11 0700 In: 2751.1 [P.O.:120; I.V.:2517.5; IV Piggyback:113.6] Out: 1350 [Urine:1350] Intake/Output this shift: Total I/O In: 1499.2 [I.V.:1462.2; IV Piggyback:37] Out: 1150 [Urine:1150]  Physical exam: NAD, alert ABD: soft, mild TTP RLQ, no peritonitis Ext: No edema, well perfused   Lab Results: CBC  Recent Labs    01/06/20 0634 01/07/20 0632  WBC 12.0* 9.7  HGB 14.5 13.8  HCT 41.1 38.9*  PLT 137* 149*   BMET Recent Labs    01/05/20 0521 01/06/20 0634  NA 136 137  K 3.3* 3.3*  CL 102 104  CO2 25 23  GLUCOSE 100* 94  BUN 15 14  CREATININE 0.78 0.69  CALCIUM 8.4* 8.2*   PT/INR No results for input(s): LABPROT, INR in the last 72 hours. ABG No results for input(s): PHART, HCO3 in the last 72 hours.  Invalid input(s): PCO2, PO2  Studies/Results: CT ABDOMEN PELVIS W CONTRAST  Result Date: 01/07/2020 CLINICAL DATA:  Right lower quadrant pain. Recent appendectomy December 2020. pericecal abscess noted on prior scan. EXAM: CT ABDOMEN AND PELVIS WITH CONTRAST TECHNIQUE: Multidetector CT imaging of the abdomen and pelvis was performed using the standard protocol following bolus administration of intravenous contrast. CONTRAST:  OMNIPAQUE IOHEXOL  300 MG/ML  SOLN COMPARISON:  01/04/2020 FINDINGS: Lower chest: Progressive subsegmental atelectasis/consolidation posteriorly at the right lung base. No pleural or pericardial effusion. Hepatobiliary: Fatty liver without focal lesion. Gallbladder nondistended, unremarkable. No biliary ductal dilatation. Pancreas: Unremarkable. No pancreatic ductal dilatation or surrounding inflammatory changes. Spleen: Splenomegaly, 18.8 cm craniocaudal length, without focal lesion. Adrenals/Urinary Tract: Adrenal glands unremarkable. Kidneys enhance normally without hydronephrosis or evident urolithiasis. Urinary bladder is incompletely distended. Stomach/Bowel: Stomach is nondistended. Small bowel decompressed. Terminal ileum unremarkable. Lipomatous ileocecal valve. The colon is nondilated with multiple diverticula throughout. Vascular/Lymphatic: No vascular pathology identified. Subcentimeter left para-aortic and aortocaval lymph nodes probably reactive. Reproductive: Prostate is unremarkable. Other: Persistent multiloculated fluid collection inferior and lateral to the cecum, probably slightly increased, no longer containing gas. Components measure up to 4.7 cm transverse, 6.9 cm craniocaudal. Persistent surrounding inflammatory/edematous changes. There is a new 2.9 cm loculated collection in the anterior inferior right lower quadrant. Trace pelvic ascites. No free air. Musculoskeletal: Degenerative disc disease L4-S1. No fracture or worrisome bone lesion. IMPRESSION: 1. Some interval worsening of multiloculated abscess, right lower quadrant adjacent to the cecum. Electronically Signed   By: Corlis Leak M.D.   On: 01/07/2020 12:08    Anti-infectives: Anti-infectives (From admission, onward)   Start     Dose/Rate Route Frequency Ordered Stop   01/05/20 0600  piperacillin-tazobactam (ZOSYN) IVPB 3.375 g     3.375 g 12.5 mL/hr over 240 Minutes Intravenous Every 8 hours 01/04/20 2300     01/04/20 2215   piperacillin-tazobactam (ZOSYN) IVPB  3.375 g     3.375 g 100 mL/hr over 30 Minutes Intravenous  Once 01/04/20 2205 01/04/20 2302      Assessment/Plan:  Worsening right-sided diverticular abscesses in need for drainage.  Discussed with Dr.Hasell who agrees w me.  There seems to be a pretty good window.  We will plan to have these done tomorrow morning.  I will add an additional saline bolus I will make sure that he is n.p.o. we call any Lovenox.  Currently his abdomen is benign he is nontoxic he does not require an emergent surgical intervention.  Obviously if he were to deteriorate or if he does not improve despite drain he may need a right colectomy.  Tensive counseling provided to the patient and the family and they understand.  Please note that I spent at least 35 minutes in this encounter with greater than 50% spent in coordination and counseling of his care  Caroleen Hamman, MD, [facility]  01/07/2020

## 2020-01-08 ENCOUNTER — Inpatient Hospital Stay: Payer: Self-pay

## 2020-01-08 DIAGNOSIS — R748 Abnormal levels of other serum enzymes: Secondary | ICD-10-CM

## 2020-01-08 DIAGNOSIS — K5720 Diverticulitis of large intestine with perforation and abscess without bleeding: Principal | ICD-10-CM

## 2020-01-08 LAB — COMPREHENSIVE METABOLIC PANEL
ALT: 30 U/L (ref 0–44)
AST: 37 U/L (ref 15–41)
Albumin: 3.1 g/dL — ABNORMAL LOW (ref 3.5–5.0)
Alkaline Phosphatase: 62 U/L (ref 38–126)
Anion gap: 8 (ref 5–15)
BUN: 8 mg/dL (ref 6–20)
CO2: 25 mmol/L (ref 22–32)
Calcium: 8.3 mg/dL — ABNORMAL LOW (ref 8.9–10.3)
Chloride: 105 mmol/L (ref 98–111)
Creatinine, Ser: 0.78 mg/dL (ref 0.61–1.24)
GFR calc Af Amer: >60 mL/min (ref >60)
GFR calc non Af Amer: >60 mL/min (ref >60)
Glucose, Bld: 87 mg/dL (ref 70–99)
Potassium: 3.5 mmol/L (ref 3.5–5.1)
Sodium: 138 mmol/L (ref 135–145)
Total Bilirubin: 2.1 mg/dL — ABNORMAL HIGH (ref 0.3–1.2)
Total Protein: 6.8 g/dL (ref 6.5–8.1)

## 2020-01-08 LAB — CBC
HCT: 36.2 % — ABNORMAL LOW (ref 39.0–52.0)
Hemoglobin: 13.1 g/dL (ref 13.0–17.0)
MCH: 34.6 pg — ABNORMAL HIGH (ref 26.0–34.0)
MCHC: 36.2 g/dL — ABNORMAL HIGH (ref 30.0–36.0)
MCV: 95.5 fL (ref 80.0–100.0)
Platelets: 168 10*3/uL (ref 150–400)
RBC: 3.79 MIL/uL — ABNORMAL LOW (ref 4.22–5.81)
RDW: 12.2 % (ref 11.5–15.5)
WBC: 6.7 10*3/uL (ref 4.0–10.5)
nRBC: 0 % (ref 0.0–0.2)

## 2020-01-08 LAB — HEPATITIS B E ANTIBODY: Hep B E Ab: NEGATIVE

## 2020-01-08 MED ORDER — FENTANYL CITRATE (PF) 100 MCG/2ML IJ SOLN
INTRAMUSCULAR | Status: AC
  Filled 2020-01-08: qty 2

## 2020-01-08 MED ORDER — MIDAZOLAM HCL 5 MG/5ML IJ SOLN
INTRAMUSCULAR | Status: AC
  Filled 2020-01-08: qty 5

## 2020-01-08 MED ORDER — FENTANYL CITRATE (PF) 100 MCG/2ML IJ SOLN
INTRAMUSCULAR | Status: DC | PRN
  Administered 2020-01-08 (×2): 50 ug via INTRAVENOUS

## 2020-01-08 MED ORDER — ENOXAPARIN SODIUM 40 MG/0.4ML ~~LOC~~ SOLN
40.0000 mg | SUBCUTANEOUS | Status: DC
  Administered 2020-01-09 – 2020-01-10 (×2): 40 mg via SUBCUTANEOUS
  Filled 2020-01-08 (×2): qty 0.4

## 2020-01-08 MED ORDER — MIDAZOLAM HCL 2 MG/2ML IJ SOLN
INTRAMUSCULAR | Status: DC | PRN
  Administered 2020-01-08 (×4): 1 mg via INTRAVENOUS

## 2020-01-08 MED ORDER — SODIUM CHLORIDE 0.9% FLUSH
5.0000 mL | Freq: Three times a day (TID) | INTRAVENOUS | Status: DC
  Administered 2020-01-08 – 2020-01-10 (×6): 5 mL

## 2020-01-08 NOTE — Procedures (Signed)
Interventional Radiology Procedure:   Indications: Right lower quadrant abscess  Procedure: CT guided drain placement  Findings: Small collection.  Placed 10 Fr drain and removed approximately 10 ml of cloudy yellow fluid.   Complications: None     EBL: less than 20 ml  Plan: Send fluid for culture.    Jacob Huerta R. Lowella Dandy, MD  Pager: [phone]

## 2020-01-08 NOTE — Progress Notes (Signed)
Patient clinically stable post Abscess Drain placement per Dr Lowella Dandy, tolerated well. Vitals stable throughout entire procedure. Purulent drainage per tube placement. Patient awake/alert and oriented post procedure, received Versed 4mg  along with Fentanyl IV for procedure, denies complaints post procedure. Report given to [facility]  On 2C/222 with questions answered. Mother at bedside with update given.

## 2020-01-08 NOTE — Progress Notes (Signed)
Subjective/Chief Complaint: Right abdominal pain has slightly decreased since admission. No respiratory symptoms at this time.    Objective: Vital signs in last 24 hours: Temp:  [97.8 F (36.6 C)-99.9 F (37.7 C)] 98.9 F (37.2 C) (04/12 1343) Pulse Rate:  [78-97] 89 (04/12 1343) Resp:  [16-20] 20 (04/12 1343) BP: (134-159)/(88-97) 155/90 (04/12 1343) SpO2:  [92 %-97 %] 94 % (04/12 1343) Last BM Date: 01/07/20  Intake/Output from previous day: 04/11 0701 - 04/12 0700 In: 6135.4 [P.O.:360; I.V.:5631.8; IV Piggyback:143.6] Out: 3550 [Urine:3550] Intake/Output this shift: Total I/O In: -  Out: 1000 [Urine:1000]  Physical exam: General: No acute distress Cardiac: RRR Lungs: Clear bilaterally Abdomen: Mild distention and mild tenderness in RLQ.  Lab Results:  Recent Labs    01/07/20 0632 01/08/20 0445  WBC 9.7 6.7  HGB 13.8 13.1  HCT 38.9* 36.2*  PLT 149* 168   BMET Recent Labs    01/06/20 0634 01/08/20 0445  NA 137 138  K 3.3* 3.5  CL 104 105  CO2 23 25  GLUCOSE 94 87  BUN 14 8  CREATININE 0.69 0.78  CALCIUM 8.2* 8.3*   PT/INR No results for input(s): LABPROT, INR in the last 72 hours. ABG No results for input(s): PHART, HCO3 in the last 72 hours.  Invalid input(s): PCO2, PO2  Studies/Results: CT ABDOMEN PELVIS W CONTRAST  Result Date: 01/07/2020 CLINICAL DATA:  Right lower quadrant pain. Recent appendectomy December 2020. pericecal abscess noted on prior scan. EXAM: CT ABDOMEN AND PELVIS WITH CONTRAST TECHNIQUE: Multidetector CT imaging of the abdomen and pelvis was performed using the standard protocol following bolus administration of intravenous contrast. CONTRAST:  134mL OMNIPAQUE IOHEXOL 300 MG/ML  SOLN COMPARISON:  01/04/2020 FINDINGS: Lower chest: Progressive subsegmental atelectasis/consolidation posteriorly at the right lung base. No pleural or pericardial effusion. Hepatobiliary: Fatty liver without focal lesion. Gallbladder nondistended,  unremarkable. No biliary ductal dilatation. Pancreas: Unremarkable. No pancreatic ductal dilatation or surrounding inflammatory changes. Spleen: Splenomegaly, 18.8 cm craniocaudal length, without focal lesion. Adrenals/Urinary Tract: Adrenal glands unremarkable. Kidneys enhance normally without hydronephrosis or evident urolithiasis. Urinary bladder is incompletely distended. Stomach/Bowel: Stomach is nondistended. Small bowel decompressed. Terminal ileum unremarkable. Lipomatous ileocecal valve. The colon is nondilated with multiple diverticula throughout. Vascular/Lymphatic: No vascular pathology identified. Subcentimeter left para-aortic and aortocaval lymph nodes probably reactive. Reproductive: Prostate is unremarkable. Other: Persistent multiloculated fluid collection inferior and lateral to the cecum, probably slightly increased, no longer containing gas. Components measure up to 4.7 cm transverse, 6.9 cm craniocaudal. Persistent surrounding inflammatory/edematous changes. There is a new 2.9 cm loculated collection in the anterior inferior right lower quadrant. Trace pelvic ascites. No free air. Musculoskeletal: Degenerative disc disease L4-S1. No fracture or worrisome bone lesion. IMPRESSION: 1. Some interval worsening of multiloculated abscess, right lower quadrant adjacent to the cecum. Electronically Signed   By: Lucrezia Europe M.D.   On: 01/07/2020 12:08    Anti-infectives: Anti-infectives (From admission, onward)   Start     Dose/Rate Route Frequency Ordered Stop   01/05/20 0600  piperacillin-tazobactam (ZOSYN) IVPB 3.375 g     3.375 g 12.5 mL/hr over 240 Minutes Intravenous Every 8 hours 01/04/20 2300     01/04/20 2215  piperacillin-tazobactam (ZOSYN) IVPB 3.375 g     3.375 g 100 mL/hr over 30 Minutes Intravenous  Once 01/04/20 2205 01/04/20 2302      Assessment/Plan: 29 yo with history of appendectomy approximately 4 months ago and inflammatory changes in right lower quadrant (?  Diverticulitis vs. Post op  abscess).  Complex fluid collection in RLQ should be amendable to percutaneous drainage.  Discussed CT guided drainage with patient.  Risks and benefits discussed with the patient including bleeding, infection, damage to adjacent structures, bowel perforation/fistula connection, and sepsis.  All of the patient's questions were answered, patient is agreeable to proceed. Consent signed and in chart.    Arn Medal 01/08/2020

## 2020-01-08 NOTE — Progress Notes (Signed)
Jacob Darby, MD [address]  [place]  [place], [place] [address]  Main: [phone]  Fax: [phone] Pager: [phone]   Subjective: Patient is admitted with diverticular abscess in the cecum.  Patient is n.p.o. for drain placement today.  His leukocytosis is improving.  His abdominal pain is improving.  His LFTs are also improving.   Objective: Vital signs in last 24 hours: Vitals:   01/08/20 1525 01/08/20 1530 01/08/20 1535 01/08/20 1540  BP: (!) 155/94 (!) 159/97 (!) 154/88 (!) 151/95  Pulse: 94 98 91 97  Resp: (!) 27 18 (!) 21 (!) 24  Temp:      TempSrc:      SpO2: 97% 95% 97% 97%  Weight:      Height:       Weight change:   Intake/Output Summary (Last 24 hours) at 01/08/2020 1545 Last data filed at 01/08/2020 1523 Gross per 24 hour  Intake 5829.26 ml  Output 3700 ml  Net 2129.26 ml     Exam: Heart:: Regular rate and rhythm, S1S2 present or without murmur or extra heart sounds Lungs: normal and clear to auscultation Abdomen: Mild right lower quadrant tenderness   Lab Results: CBC Latest Ref Rng & Units 01/08/2020 01/07/2020 01/06/2020  WBC 4.0 - 10.5 K/uL 6.7 9.7 12.0(H)  Hemoglobin 13.0 - 17.0 g/dL 13.1 13.8 14.5  Hematocrit 39.0 - 52.0 % 36.2(L) 38.9(L) 41.1  Platelets 150 - 400 K/uL 168 149(L) 137(L)   CMP Latest Ref Rng & Units 01/08/2020 01/06/2020 01/05/2020  Glucose 70 - 99 mg/dL 87 94 100(H)  BUN 6 - 20 mg/dL 8 14 15   Creatinine 0.61 - 1.24 mg/dL 0.78 0.69 0.78  Sodium 135 - 145 mmol/L 138 137 136  Potassium 3.5 - 5.1 mmol/L 3.5 3.3(L) 3.3(L)  Chloride 98 - 111 mmol/L 105 104 102  CO2 22 - 32 mmol/L 25 23 25   Calcium 8.9 - 10.3 mg/dL 8.3(L) 8.2(L) 8.4(L)  Total Protein 6.5 - 8.1 g/dL 6.8 6.7 7.2  Total Bilirubin 0.3 - 1.2 mg/dL 2.1(H) 3.1(H) 3.9(H)  Alkaline Phos 38 - 126 U/L 62 59 57  AST 15 - 41 U/L 37 25 40  ALT 0 - 44 U/L 30 30 56(H)    Micro Results: Recent Results (from the past 240 hour(s))  SARS CORONAVIRUS 2 (TAT  6-24 HRS) Nasopharyngeal Nasopharyngeal Swab     Status: None   Collection Time: 01/05/20  8:01 AM   Specimen: Nasopharyngeal Swab  Result Value Ref Range Status   SARS Coronavirus 2 NEGATIVE NEGATIVE Final    Comment: (NOTE) SARS-CoV-2 target nucleic acids are NOT DETECTED. The SARS-CoV-2 RNA is generally detectable in upper and lower respiratory specimens during the acute phase of infection. Negative results do not preclude SARS-CoV-2 infection, do not rule out co-infections with other pathogens, and should not be used as the sole basis for treatment or other patient management decisions. Negative results must be combined with clinical observations, patient history, and epidemiological information. The expected result is Negative. Fact Sheet for Patients: SugarRoll.be Fact Sheet for Healthcare Providers: https://www.woods-mathews.com/ This test is not yet approved or cleared by the [place] FDA and  has been authorized for detection and/or diagnosis of SARS-CoV-2 by FDA under an Emergency Use Authorization (EUA). This EUA will remain  in effect (meaning this test can be used) for the duration of the COVID-19 declaration under Section 56 4(b)(1) of the Act, 21 U.S.C. section 360bbb-3(b)(1), unless the authorization is terminated or revoked  sooner. Performed at [facility], [address], [place], [place] [address]    Studies/Results: CT ABDOMEN PELVIS W CONTRAST  Result Date: 01/07/2020 CLINICAL DATA:  Right lower quadrant pain. Recent appendectomy December 2020. pericecal abscess noted on prior scan. EXAM: CT ABDOMEN AND PELVIS WITH CONTRAST TECHNIQUE: Multidetector CT imaging of the abdomen and pelvis was performed using the standard protocol following bolus administration of intravenous contrast. CONTRAST:  OMNIPAQUE IOHEXOL 300 MG/ML  SOLN COMPARISON:  01/04/2020 FINDINGS: Lower chest: Progressive subsegmental  atelectasis/consolidation posteriorly at the right lung base. No pleural or pericardial effusion. Hepatobiliary: Fatty liver without focal lesion. Gallbladder nondistended, unremarkable. No biliary ductal dilatation. Pancreas: Unremarkable. No pancreatic ductal dilatation or surrounding inflammatory changes. Spleen: Splenomegaly, 18.8 cm craniocaudal length, without focal lesion. Adrenals/Urinary Tract: Adrenal glands unremarkable. Kidneys enhance normally without hydronephrosis or evident urolithiasis. Urinary bladder is incompletely distended. Stomach/Bowel: Stomach is nondistended. Small bowel decompressed. Terminal ileum unremarkable. Lipomatous ileocecal valve. The colon is nondilated with multiple diverticula throughout. Vascular/Lymphatic: No vascular pathology identified. Subcentimeter left para-aortic and aortocaval lymph nodes probably reactive. Reproductive: Prostate is unremarkable. Other: Persistent multiloculated fluid collection inferior and lateral to the cecum, probably slightly increased, no longer containing gas. Components measure up to 4.7 cm transverse, 6.9 cm craniocaudal. Persistent surrounding inflammatory/edematous changes. There is a new 2.9 cm loculated collection in the anterior inferior right lower quadrant. Trace pelvic ascites. No free air. Musculoskeletal: Degenerative disc disease L4-S1. No fracture or worrisome bone lesion. IMPRESSION: 1. Some interval worsening of multiloculated abscess, right lower quadrant adjacent to the cecum. Electronically Signed   By: Corlis Leak M.D.   On: 01/07/2020 12:08   Medications:  I have reviewed the patient's current medications. Prior to Admission:  Medications Prior to Admission  Medication Sig Dispense Refill Last Dose  . albuterol (PROVENTIL HFA;VENTOLIN HFA) 108 (90 BASE) MCG/ACT inhaler Inhale 2 puffs into the lungs every 6 (six) hours as needed. For breathing   prn at prn   Scheduled: . fentaNYL      . fentaNYL      . ketorolac   30 mg Intravenous Q6H  . midazolam      . pantoprazole (PROTONIX) IV  40 mg Intravenous QHS   Continuous: . sodium chloride Stopped (01/08/20 0526)  . lactated ringers Stopped (01/08/20 1444)  . piperacillin-tazobactam (ZOSYN)  IV Stopped (01/08/20 1444)   DUK:GURKYH chloride, acetaminophen, albuterol, fentaNYL, HYDROmorphone (DILAUDID) injection, midazolam, ondansetron **OR** ondansetron (ZOFRAN) IV Anti-infectives (From admission, onward)   Start     Dose/Rate Route Frequency Ordered Stop   01/05/20 0600  piperacillin-tazobactam (ZOSYN) IVPB 3.375 g     3.375 g 12.5 mL/hr over 240 Minutes Intravenous Every 8 hours 01/04/20 2300     01/04/20 2215  piperacillin-tazobactam (ZOSYN) IVPB 3.375 g     3.375 g 100 mL/hr over 30 Minutes Intravenous  Once 01/04/20 2205 01/04/20 2302     Scheduled Meds: . fentaNYL      . fentaNYL      . ketorolac  30 mg Intravenous Q6H  . midazolam      . pantoprazole (PROTONIX) IV  40 mg Intravenous QHS   Continuous Infusions: . sodium chloride Stopped (01/08/20 0526)  . lactated ringers Stopped (01/08/20 1444)  . piperacillin-tazobactam (ZOSYN)  IV Stopped (01/08/20 1444)   PRN Meds:.sodium chloride, acetaminophen, albuterol, fentaNYL, HYDROmorphone (DILAUDID) injection, midazolam, ondansetron **OR** ondansetron (ZOFRAN) IV   Assessment: Active Problems:   Diverticulitis of large intestine with abscess without bleeding  Abnormal liver enzymes   Plan: Elevated LFTs, secondary to sepsis LFTs are improving, no evidence of biliary obstruction or choledocholithiasis Acute viral hepatitis panel is negative No further work-up from liver standpoint at this time  Pericecal abscess, plan for drain placement today Patient will need colonoscopy in future after healing of the abscess  GI will sign off at this time, please call us back with questions or concerns   LOS: 4 days   Sasan Wilkie 01/08/2020, 3:45 PM

## 2020-01-08 NOTE — Progress Notes (Signed)
[facility] SURGICAL PROGRESS NOTE (cpt [phone])  Hospital Day(s): 4.   Interval History: Patient seen and examined, no acute events or new complaints overnight. Patient reports he continues to have right sided abdominal soreness. No chills, nausea, or emesis. T-max in last 24 hours 101.6. His leukocytosis remains resolved at 6.7K this morning. AST/ALT normalized although he continues to have improving hyperbilirubinemia now 2.1. Plan for IR placement of percutaneous drain.   Review of Systems:  Constitutional: denies fever, chills  HEENT: denies cough or congestion  Respiratory: denies any shortness of breath  Cardiovascular: denies chest pain or palpitations  Gastrointestinal: + abdominal pain, denied N/V, or diarrhea/and bowel function as per interval history  Vital signs in last 24 hours: [min-max] current  Temp:  [97.8 F (36.6 C)-101.6 F (38.7 C)] 99.9 F (37.7 C) (04/12 0457) Pulse Rate:  [78-103] 97 (04/12 0457) Resp:  [16-20] 20 (04/12 0457) BP: (134-147)/(88-94) 147/91 (04/12 0457) SpO2:  [92 %-96 %] 96 % (04/12 0457)     Height: 5\' 8"  (172.7 cm) Weight: 108.9 kg BMI (Calculated): 36.5   Intake/Output last 2 shifts:  04/11 0701 - 04/12 0700 In: 6135.4 [P.O.:360; I.V.:5631.8; IV Piggyback:143.6] Out: 3550 [Urine:3550]   Physical Exam:  Constitutional: alert, cooperative and no distress  HENT: normocephalic without obvious abnormality  Eyes: PERRL, EOM's grossly intact and symmetric  Respiratory: breathing non-labored at rest  Cardiovascular: regular rate and sinus rhythm  Gastrointestinal: Soft, mild right sided abdominal pain, and non-distended. No rebound/guarding Musculoskeletal: no edema or wounds, motor and sensation grossly intact, NT    Labs:  CBC Latest Ref Rng & Units 01/08/2020 01/07/2020 01/06/2020  WBC 4.0 - 10.5 K/uL 6.7 9.7 12.0(H)  Hemoglobin 13.0 - 17.0 g/dL 13.1 13.8 14.5  Hematocrit 39.0 - 52.0 % 36.2(L) 38.9(L) 41.1  Platelets 150 -  400 K/uL 168 149(L) 137(L)   CMP Latest Ref Rng & Units 01/08/2020 01/06/2020 01/05/2020  Glucose 70 - 99 mg/dL 87 94 100(H)  BUN 6 - 20 mg/dL 8 14 15   Creatinine 0.61 - 1.24 mg/dL 0.78 0.69 0.78  Sodium 135 - 145 mmol/L 138 137 136  Potassium 3.5 - 5.1 mmol/L 3.5 3.3(L) 3.3(L)  Chloride 98 - 111 mmol/L 105 104 102  CO2 22 - 32 mmol/L 25 23 25   Calcium 8.9 - 10.3 mg/dL 8.3(L) 8.2(L) 8.4(L)  Total Protein 6.5 - 8.1 g/dL 6.8 6.7 7.2  Total Bilirubin 0.3 - 1.2 mg/dL 2.1(H) 3.1(H) 3.9(H)  Alkaline Phos 38 - 126 U/L 62 59 57  AST 15 - 41 U/L 37 25 40  ALT 0 - 44 U/L 30 30 56(H)     Imaging studies: No new pertinent imaging studies   Assessment/Plan: (ICD-10's: K3.20) 29 y.o. male with complicated right diverticulitis with abscess awaiting percutaneous drain placement, and concomitant improving hyperbilirubinemia thought to be secondary to alcohol abuse.     - NPO for IR procedure; okay to resume diet following this   - Continue IV ABx (Zosyn)  - pain control prn; antiemetics prn  - monitor abdominal examination  - no plans for emergent surgical intervention; will likely benefit from right colectomy as outpatient setting   - medical management of comorbid conditions  - appreciate GI assistance   - mobilization encouraged  - Hold DVT prophylaxis for IR procedure   All of the above findings and recommendations were discussed with the patient, and the medical team, and all of patient's questions were answered to his expressed satisfaction.  -- Edison Simon,  PA-C [facility] 01/08/2020, 8:49 AM [phone] M-F: 7am - 4pm

## 2020-01-09 LAB — CBC
HCT: 35.2 % — ABNORMAL LOW (ref 39.0–52.0)
Hemoglobin: 12.9 g/dL — ABNORMAL LOW (ref 13.0–17.0)
MCH: 34.7 pg — ABNORMAL HIGH (ref 26.0–34.0)
MCHC: 36.6 g/dL — ABNORMAL HIGH (ref 30.0–36.0)
MCV: 94.6 fL (ref 80.0–100.0)
Platelets: 225 10*3/uL (ref 150–400)
RBC: 3.72 MIL/uL — ABNORMAL LOW (ref 4.22–5.81)
RDW: 11.9 % (ref 11.5–15.5)
WBC: 6.5 10*3/uL (ref 4.0–10.5)
nRBC: 0 % (ref 0.0–0.2)

## 2020-01-09 LAB — CELIAC DISEASE PANEL
Endomysial Ab, IgA: NEGATIVE
IgA: 285 mg/dL (ref 90–386)
Tissue Transglutaminase Ab, IgA: <2 U/mL (ref 0–3)

## 2020-01-09 MED ORDER — HYDROMORPHONE HCL 1 MG/ML IJ SOLN: 0.5000 mg | INTRAMUSCULAR | Status: DC | PRN

## 2020-01-09 MED ORDER — KCL IN DEXTROSE-NACL 20-5-0.45 MEQ/L-%-% IV SOLN
INTRAVENOUS | Status: DC
  Filled 2020-01-09 (×3): qty 1000

## 2020-01-09 MED ORDER — OXYCODONE HCL 5 MG PO TABS
5.0000 mg | ORAL_TABLET | ORAL | Status: DC | PRN
  Administered 2020-01-09 – 2020-01-10 (×4): 10 mg via ORAL
  Filled 2020-01-09 (×4): qty 2

## 2020-01-09 NOTE — Progress Notes (Signed)
Referring Physician(s): Piscoya,J  Supervising Physician: Richarda Overlie  Patient Status:  [facility] - In-pt  Chief Complaint: Right lower abdominal pain   Subjective: Patient doing fairly well this evening.  Has some tenderness at RLQ drain insertion site but not worsening.  Denies nausea or vomiting.  Tolerating liquids okay   Allergies: Uncaria tomentosa (cats claw) and Hydrocodone  Medications: Prior to Admission medications   Medication Sig Start Date End Date Taking? Authorizing Provider  albuterol (PROVENTIL HFA;VENTOLIN HFA) 108 (90 BASE) MCG/ACT inhaler Inhale 2 puffs into the lungs every 6 (six) hours as needed. For breathing   Yes [provider]     Vital Signs: BP (!) 145/95 (BP Location: Right Arm)   Pulse 75   Temp 98.5 F (36.9 C) (Oral)   Resp 20   Ht 5\' 8"  (1.727 m)   Wt 240 lb (108.9 kg)   SpO2 97%   BMI 36.49 kg/m   Physical Exam awake, alert.  Right lower quadrant drain intact, insertion site okay, mildly tender to palpation, output 20 cc yellow fluid yesterday, about 5 to 10 cc in JP bulb at this time.  Drain irrigated without difficulty  Imaging: CT ABDOMEN PELVIS W CONTRAST  Result Date: 01/07/2020 CLINICAL DATA:  Right lower quadrant pain. Recent appendectomy December 2020. pericecal abscess noted on prior scan. EXAM: CT ABDOMEN AND PELVIS WITH CONTRAST TECHNIQUE: Multidetector CT imaging of the abdomen and pelvis was performed using the standard protocol following bolus administration of intravenous contrast. CONTRAST:  January 2021 OMNIPAQUE IOHEXOL 300 MG/ML  SOLN COMPARISON:  01/04/2020 FINDINGS: Lower chest: Progressive subsegmental atelectasis/consolidation posteriorly at the right lung base. No pleural or pericardial effusion. Hepatobiliary: Fatty liver without focal lesion. Gallbladder nondistended, unremarkable. No biliary ductal dilatation. Pancreas: Unremarkable. No pancreatic ductal dilatation or surrounding inflammatory changes. Spleen:  Splenomegaly, 18.8 cm craniocaudal length, without focal lesion. Adrenals/Urinary Tract: Adrenal glands unremarkable. Kidneys enhance normally without hydronephrosis or evident urolithiasis. Urinary bladder is incompletely distended. Stomach/Bowel: Stomach is nondistended. Small bowel decompressed. Terminal ileum unremarkable. Lipomatous ileocecal valve. The colon is nondilated with multiple diverticula throughout. Vascular/Lymphatic: No vascular pathology identified. Subcentimeter left para-aortic and aortocaval lymph nodes probably reactive. Reproductive: Prostate is unremarkable. Other: Persistent multiloculated fluid collection inferior and lateral to the cecum, probably slightly increased, no longer containing gas. Components measure up to 4.7 cm transverse, 6.9 cm craniocaudal. Persistent surrounding inflammatory/edematous changes. There is a new 2.9 cm loculated collection in the anterior inferior right lower quadrant. Trace pelvic ascites. No free air. Musculoskeletal: Degenerative disc disease L4-S1. No fracture or worrisome bone lesion. IMPRESSION: 1. Some interval worsening of multiloculated abscess, right lower quadrant adjacent to the cecum. Electronically Signed   By: 03/05/2020 M.D.   On: 01/07/2020 12:08   CT IMAGE GUIDED DRAINAGE BY PERCUTANEOUS CATHETER  Result Date: 01/08/2020 INDICATION: 29 year old with an inflammatory process in the right lower quadrant and small complex abscess collections. Plan for CT-guided drainage. EXAM: CT GUIDED DRAINAGE OF RIGHT LOWER ABDOMINAL ABSCESS MEDICATIONS: The patient is currently admitted to the hospital and receiving intravenous antibiotics. ANESTHESIA/SEDATION: 4.0 mg IV Versed 100 mcg IV Fentanyl Moderate Sedation Time:  35 minutes The patient was continuously monitored during the procedure by the interventional radiology nurse under my direct supervision. COMPLICATIONS: None immediate. TECHNIQUE: Informed written consent was obtained from the patient  after a thorough discussion of the procedural risks, benefits and alternatives. All questions were addressed. A timeout was performed prior to the initiation of the procedure. PROCEDURE: Patient was  placed supine on the CT scanner. Images through the lower abdomen were obtained. Small collection in the lateral right lower quadrant was identified and targeted. The right side of the abdomen was shaved. Puncture site was localized using CT. The skin was prepped with chlorhexidine and sterile field was created. Skin and soft tissues were anesthetized. Small incision was made. 18 gauge trocar needle was directed towards the collection with CT guidance. Needle was too posterior to the abscess. Therefore, another small incision was made slightly more anterior. 18 gauge trocar needle was directed into the abscess using CT guidance. Initially, yellow fluid was draining from the needle. The fluid turned more cloudy as additional fluid was removed. Super stiff Amplatz wire was advanced into the collection and the tract was dilated to accommodate a 10.2 [place] multipurpose drain. Approximately 5-10 mL of cloudy yellow fluid was removed. Fluid was sample was sent for culture. Drain was attached to a suction bulb. Catheter was secured to skin with suture and StatLock. Bandage was placed. FINDINGS: Small fluid collection along the right lower abdomen. Needle position was confirmed within the collection. Cloudy yellow fluid was removed from the collection and compatible with a small abscess collection. Drain was coiled in right lower quadrant at the end of the procedure. IMPRESSION: CT-guided placement of a drainage catheter within the small right lower quadrant abscess. Fluid sample sent for culture. Electronically Signed   By: Markus Daft M.D.   On: 01/08/2020 16:46    Labs:  CBC: Recent Labs    01/06/20 0634 01/07/20 0632 01/08/20 0445 01/09/20 0550  WBC 12.0* 9.7 6.7 6.5  HGB 14.5 13.8 13.1 12.9*  HCT 41.1 38.9*  36.2* 35.2*  PLT 137* 149* 168 225    COAGS: No results for input(s): INR, APTT in the last 8760 hours.  BMP: Recent Labs    01/04/20 1908 01/05/20 0521 01/06/20 0634 01/08/20 0445  NA 135 136 137 138  K 3.4* 3.3* 3.3* 3.5  CL 102 102 104 105  CO2 22 25 23 25   GLUCOSE 136* 100* 94 87  BUN 11 15 14 8   CALCIUM 8.9 8.4* 8.2* 8.3*  CREATININE 0.80 0.78 0.69 0.78  GFRNONAA >60 >60 >60 >60  GFRAA >60 >60 >60 >60    LIVER FUNCTION TESTS: Recent Labs    01/04/20 1908 01/05/20 0521 01/06/20 0634 01/08/20 0445  BILITOT 3.5* 3.9* 3.1* 2.1*  AST 57* 40 25 37  ALT 73* 56* 30 30  ALKPHOS 64 57 59 62  PROT 7.9 7.2 6.7 6.8  ALBUMIN 4.2 3.6 3.2* 3.1*    Assessment and Plan: Patient status post appendectomy approx 4 months ago, now with complex fluid collection right lower quadrant,? diverticulitis versus postop abscess; s/p right lower quadrant drain placement on 4/12; afebrile, WBC normal, hemoglobin stable, drain fluid cultures pending; continue drain irrigation every 8 hours while in-house and once daily with 5 cc sterile normal saline as outpatient; as OP patient should record output of drain daily and change overlying gauze dressing every 2 to 3 days; he will be scheduled for follow-up in IR drain clinic in 7 to 10 days post discharge   Electronically Signed: D. Rowe Robert, PA-C 01/09/2020, 4:44 PM   I spent a total of 15 minutes at the the patient's bedside AND on the patient's hospital floor or unit, greater than 50% of which was counseling/coordinating care for right lower abdominal fluid collection drainage    Patient ID: Jacob Huerta, male   DOB: 01-16-1991, 28  y.o.   MRN: [id number]

## 2020-01-09 NOTE — Progress Notes (Signed)
[facility] SURGICAL PROGRESS NOTE (cpt [phone])  Hospital Day(s): 5.    Interval History: Patient seen and examined, no acute events or new complaints overnight. Patient reports he is overall feeling better. He still has primarily right sided soreness but again he reports this is overall improved. No fever, chills, nausea, or emesis. Leukocytosis remains resolved with WBC of 6.5 this morning. He is s/p percutaneous drainage of abscess with IR yesterday 04/12. Restarted on CLD yesterday and toelrating well. No new complaints.   Review of Systems:  Constitutional: denies fever, chills  HEENT: denies cough or congestion  Respiratory: denies any shortness of breath  Cardiovascular: denies chest pain or palpitations  Gastrointestinal: + abdominal pain (Soreness, improved), denied N/V, or diarrhea/and bowel function as per interval history Genitourinary: denies burning with urination or urinary frequency   Vital signs in last 24 hours: [min-max] current  Temp:  [98.4 F (36.9 C)-99.5 F (37.5 C)] 98.5 F (36.9 C) (04/13 0443) Pulse Rate:  [71-98] 71 (04/13 0443) Resp:  [16-34] 20 (04/13 0443) BP: (145-159)/(87-98) 145/97 (04/13 0443) SpO2:  [94 %-99 %] 95 % (04/13 0443)     Height: 5\' 8"  (172.7 cm) Weight: 108.9 kg BMI (Calculated): 36.5   Intake/Output last 2 shifts:  04/12 0701 - 04/13 0700 In: 2855.4 [P.O.:118; I.V.:2568.1; IV Piggyback:159.3] Out: 3618 [Urine:3600; Drains:18]   Physical Exam:  Constitutional: alert, cooperative and no distress  HENT: normocephalic without obvious abnormality  Eyes: PERRL, EOM's grossly intact and symmetric  Respiratory: breathing non-labored at rest  Cardiovascular: regular rate and sinus rhythm  Gastrointestinal: Soft, mild right sided abdominal pain (improved), and non-distended. No rebound/guarding. Percutaneous drain in the RLQ with serous output. Musculoskeletal: no edema or wounds, motor and sensation grossly intact, NT     Labs:  CBC Latest Ref Rng & Units 01/09/2020 01/08/2020 01/07/2020  WBC 4.0 - 10.5 K/uL 6.5 6.7 9.7  Hemoglobin 13.0 - 17.0 g/dL 12.9(L) 13.1 13.8  Hematocrit 39.0 - 52.0 % 35.2(L) 36.2(L) 38.9(L)  Platelets 150 - 400 K/uL 225 168 149(L)   CMP Latest Ref Rng & Units 01/08/2020 01/06/2020 01/05/2020  Glucose 70 - 99 mg/dL 87 94 100(H)  BUN 6 - 20 mg/dL 8 14 15   Creatinine 0.61 - 1.24 mg/dL 0.78 0.69 0.78  Sodium 135 - 145 mmol/L 138 137 136  Potassium 3.5 - 5.1 mmol/L 3.5 3.3(L) 3.3(L)  Chloride 98 - 111 mmol/L 105 104 102  CO2 22 - 32 mmol/L 25 23 25   Calcium 8.9 - 10.3 mg/dL 8.3(L) 8.2(L) 8.4(L)  Total Protein 6.5 - 8.1 g/dL 6.8 6.7 7.2  Total Bilirubin 0.3 - 1.2 mg/dL 2.1(H) 3.1(H) 3.9(H)  Alkaline Phos 38 - 126 U/L 62 59 57  AST 15 - 41 U/L 37 25 40  ALT 0 - 44 U/L 30 30 56(H)    Imaging studies: No new pertinent imaging studies   Assessment/Plan: (ICD-10's: K107.20) 29 y.o. male with complicated right diverticulitis with abscess s/p percutaneous drain placement on 04/12, and concomitant improving hyperbilirubinemia thought to be secondary to alcohol abuse.    - ADAT  - Continue IV ABx (Zosyn)             - pain control prn; antiemetics prn             - monitor abdominal examination             - no plans for emergent surgical intervention; will likely benefit from right colectomy as outpatient setting              -  medical management of comorbid conditions             - appreciate GI assistance              - mobilization encouraged  - DVT prophylaxis    - Discharge Planning: Home tomorrowhoepfully  All of the above findings and recommendations were discussed with the patient, and the medical team, and all of patient's questions were answered to his expressed satisfaction.  -- Lynden Oxford, PA-C [facility] 01/09/2020, 7:31 AM [phone] M-F: 7am - 4pm

## 2020-01-10 ENCOUNTER — Other Ambulatory Visit: Payer: Self-pay | Admitting: Surgery

## 2020-01-10 DIAGNOSIS — K651 Peritoneal abscess: Secondary | ICD-10-CM

## 2020-01-10 LAB — ALPHA-1 ANTITRYPSIN PHENOTYPE: A-1 Antitrypsin, Ser: 242 mg/dL — ABNORMAL HIGH (ref 95–164)

## 2020-01-10 MED ORDER — AMOXICILLIN-POT CLAVULANATE 875-125 MG PO TABS: 1.0000 | ORAL_TABLET | Freq: Two times a day (BID) | ORAL | 0 refills | Status: AC

## 2020-01-10 MED ORDER — OXYCODONE HCL 5 MG PO TABS: 5.0000 mg | ORAL_TABLET | Freq: Four times a day (QID) | ORAL | 0 refills | Status: DC | PRN

## 2020-01-10 MED ORDER — SODIUM CHLORIDE 0.9% FLUSH: 5.0000 mL | Freq: Once | INTRAVENOUS | 0 refills | Status: AC

## 2020-01-10 MED ORDER — SODIUM CHLORIDE 0.9% FLUSH: 5.0000 mL | Freq: Once | INTRAVENOUS | 0 refills | Status: DC

## 2020-01-10 NOTE — Progress Notes (Signed)
Jacob Huerta to be D/C'd home per MD order.  Discussed prescriptions and follow up appointments with the patient. Prescriptions given to patient, medication list explained in detail. Pt verbalized understanding.  Allergies as of 01/10/2020       Reactions   Uncaria [place]osa (cats Claw) Itching   Hydrocodone    Vomiting, itching        Medication List     TAKE these medications    albuterol 108 (90 Base) MCG/ACT inhaler Commonly known as: VENTOLIN HFA Inhale 2 puffs into the lungs every 6 (six) hours as needed. For breathing   amoxicillin-clavulanate 875-125 MG tablet Commonly known as: Augmentin Take 1 tablet by mouth 2 (two) times daily for 10 days. Notes to patient: 01/10/20   oxyCODONE 5 MG immediate release tablet Commonly known as: Oxy IR/ROXICODONE Take 1 tablet (5 mg total) by mouth every 6 (six) hours as needed for severe pain or breakthrough pain.   sodium chloride flush 0.9 % Soln Commonly known as: NS Inject 5 mLs into the vein once for 1 dose. Flush 5 mLs into drain once daily, do not flush into vein        Vitals:   01/09/20 2115 01/10/20 0552  BP: 138/87 (!) 143/89  Pulse: 70 74  Resp: 18 18  Temp: 97.8 F (36.6 C) 98.3 F (36.8 C)  SpO2: 96% 95%    Skin clean, dry and intact without evidence of skin break down, no evidence of skin tears noted. IV catheter discontinued intact. Site without signs and symptoms of complications. Dressing and pressure applied. Pt denies pain at this time. No complaints noted.  An After Visit Summary was printed and given to the patient. Patient escorted via WC, and D/C home via private auto.  Jacob Huerta Jacob Huerta

## 2020-01-10 NOTE — Discharge Summary (Signed)
[facility] SURGICAL DISCHARGE SUMMARY (cpt: [phone])  Patient ID: Jacob Huerta MRN: [id number] DOB/AGE: Jun 28, 1991 29 y.o.  Admit date: 01/04/2020 Discharge date: 01/10/2020  Discharge Diagnoses Patient Active Problem List   Diagnosis Date Noted  . Diverticulitis of large intestine with abscess without bleeding 01/04/2020    Consultants Interventional Radiology  Procedures 01/08/2020:  CT Guided Percutaneous Drain Placement  HPI: Jacob Huerta is a 29 y.o. male presenting with a two day history of abdominal pain in the right lower quadrant.  He reports that the pain started the night of 4/7 around 11 pm.  He thought it was related to constipation and took a laxative but it did not help the pain.  He did end up having multiple bouts of diarrhea that have been ongoing today.  The pain has continued and worsened.  Reports feeling hot and cold, but no true fever noted.  He also has had nausea but no emesis.  The pain is in the right side, and does not radiate.    Of note, he has a history of appendicitis and underwent a laparoscopic appendectomy with Dr. Tonna Boehringer on 09/27/19.  Patient reports that the pain today is worse than back then.  In the ED, his laboratory workup is significant for a WBC of 18.3 with very hemoconcentrated sample, with Hgb of 17.8 and Hct 49.2.  His Cr is normal at 0.80, but his total bilirubin is elevated to 3.5, with AST 57 and ALT 79.  Back on 08/2019, he did have mildly elevated total bili of 1.8 with AST 83 and ALT 104.  He also had a CT scan tonight of abdomen and pelvis which shows a 4.5 x 1.7 x 7.3 cm abscess in the right lower quadrant abutting the cecum, with diverticular disease surrounding and stranding, suspicious for diverticulitis with abscess.  There is no appendiceal stump and given that it's 3 months since his surgery, much less likely to be a complication from that surgery.  Hospital Course: He was admitted to the general surgery  service and conservative management was initiated with NPO, IVF, and IV Abx. He did well and his leukocytosis resolved and pain slightly improved. He did have a repeat CT Abdomen/Pelvis on 04/11 which showed a worsening intra-abdominal abscess. He had a drain placed with IR on 04/12. He did well following this and pain continued to improve. Advancement of patient's diet and ambulation were well-tolerated. The remainder of patient's hospital course was essentially unremarkable, and discharge planning was initiated accordingly with patient safely able to be discharged home with appropriate discharge instructions, antibiotics (augmentin x 10 days), pain control, and outpatient follow-up after all of his questions were answered to his expressed satisfaction.   Discharge Condition: Good   Physical Examination:  Constitutional: alert, cooperative and no distress  HENT: normocephalic without obvious abnormality  Eyes: PERRL, EOM's grossly intact and symmetric  Respiratory: breathing non-labored at rest  Cardiovascular: regular rate and sinus rhythm  Gastrointestinal:Soft,improved right sided abdominal pain, and non-distended. No rebound/guarding. Percutaneous drain in the RLQ with serous output. Musculoskeletal: no edema or wounds, motor and sensation grossly intact, NT    Allergies as of 01/10/2020      Reactions   Uncaria Tomentosa (cats Claw) Itching   Hydrocodone    Vomiting, itching      Medication List    TAKE these medications   albuterol 108 (90 Base) MCG/ACT inhaler Commonly known as: VENTOLIN HFA Inhale 2 puffs into the lungs every 6 (six) hours  as needed. For breathing   amoxicillin-clavulanate 875-125 MG tablet Commonly known as: Augmentin Take 1 tablet by mouth 2 (two) times daily for 10 days.   oxyCODONE 5 MG immediate release tablet Commonly known as: Oxy IR/ROXICODONE Take 1 tablet (5 mg total) by mouth every 6 (six) hours as needed for severe pain or breakthrough  pain.   sodium chloride flush 0.9 % Soln Commonly known as: NS Inject 5 mLs into the vein once for 1 dose.        Follow-up Information    Piscoya, Jacqulyn Bath, MD. Schedule an appointment as soon as possible for a visit in 2 week(s).   Specialty: General Surgery Why: 2 week hospital follow up, diverticulitis with abscess Contact information: [address] [place] [address] [phone]        [facility] Follow up.   Why: IR will schedule him an appointment in their drain clinic in 7-10 days           Time spent on discharge management including discussion of hospital course, clinical condition, outpatient instructions, prescriptions, and follow up with the patient and members of the medical team: >30 minutes  -- Edison Simon , PA-C [facility]  01/10/2020, 9:37 AM [phone] M-F: 7am - 4pm

## 2020-01-13 LAB — AEROBIC/ANAEROBIC CULTURE W GRAM STAIN (SURGICAL/DEEP WOUND)

## 2020-01-17 ENCOUNTER — Ambulatory Visit
Admission: RE | Admit: 2020-01-17 | Discharge: 2020-01-17 | Disposition: A | Discharge status: Home / Self Care | Payer: Self-pay | Source: Ambulatory Visit | Attending: Surgery | Admitting: Surgery

## 2020-01-17 ENCOUNTER — Other Ambulatory Visit: Payer: Self-pay | Admitting: Surgery

## 2020-01-17 ENCOUNTER — Ambulatory Visit
Admission: RE | Admit: 2020-01-17 | Discharge: 2020-01-17 | Disposition: A | Discharge status: Home / Self Care | Payer: Self-pay | Source: Ambulatory Visit | Attending: Radiology | Admitting: Radiology

## 2020-01-17 DIAGNOSIS — K651 Peritoneal abscess: Secondary | ICD-10-CM

## 2020-01-17 HISTORY — PX: IR RADIOLOGIST EVAL & MGMT: IMG5224

## 2020-01-17 MED ORDER — IOPAMIDOL (ISOVUE-300) INJECTION 61%
100.0000 mL | Freq: Once | INTRAVENOUS | Status: AC | PRN
  Administered 2020-01-17: 100 mL via INTRAVENOUS

## 2020-01-17 NOTE — Progress Notes (Addendum)
Referring Physician(s): Dr Quenton Fetter  Chief Complaint: The patient is seen in follow up today s/p Divertic abscess drain placed in IR 01/08/20  History of present illness:  Previous Hx appendectomy 09/24/19 Developed abd pain and N/V Imaging revealed new abd abscess-- presumed diverticulitis  OP now little to none Flushes 5 cc daily Color is now serous/bloody Connected to JP Denies fever/chills or pain Still taking Augmentin BID  Scheduled today for CT and drain injection    Past Medical History:  Diagnosis Date  . Asthma     Past Surgical History:  Procedure Laterality Date  . LAPAROSCOPIC APPENDECTOMY N/A 09/27/2019   Procedure: APPENDECTOMY LAPAROSCOPIC;  Surgeon: Sung Amabile, DO;  Location: [facility] ORS;  Service: General;  Laterality: N/A;  . OTHER SURGICAL HISTORY     jaw surgery, rotator cuff surgery    Allergies: Uncaria tomentosa (cats claw) and Hydrocodone  Medications: Prior to Admission medications   Medication Sig Start Date End Date Taking? Authorizing Provider  albuterol (PROVENTIL HFA;VENTOLIN HFA) 108 (90 BASE) MCG/ACT inhaler Inhale 2 puffs into the lungs every 6 (six) hours as needed. For breathing    [provider]  amoxicillin-clavulanate (AUGMENTIN) 875-125 MG tablet Take 1 tablet by mouth 2 (two) times daily for 10 days. 01/10/20 01/20/20  Donovan Kail, PA-C  oxyCODONE (OXY IR/ROXICODONE) 5 MG immediate release tablet Take 1 tablet (5 mg total) by mouth every 6 (six) hours as needed for severe pain or breakthrough pain. 01/10/20   Donovan Kail, PA-C     No family history on file.  Social History   Socioeconomic History  . Marital status: Single    Spouse name: Not on file  . Number of children: Not on file  . Years of education: Not on file  . Highest education level: Not on file  Occupational History  . Not on file  Tobacco Use  . Smoking status: Current Every Day Smoker    Packs/day: 0.25    Types: Cigarettes  .  Smokeless tobacco: Never Used  Substance and Sexual Activity  . Alcohol use: Yes    Comment: occ  . Drug use: No  . Sexual activity: Not on file  Other Topics Concern  . Not on file  Social History Narrative  . Not on file   Social Determinants of Health   Financial Resource Strain:   . Difficulty of Paying Living Expenses:   Food Insecurity:   . Worried About Programme researcher, broadcasting/film/video in the Last Year:   . Barista in the Last Year:   Transportation Needs:   . Freight forwarder (Medical):   Marland Kitchen Lack of Transportation (Non-Medical):   Physical Activity:   . Days of Exercise per Week:   . Minutes of Exercise per Session:   Stress:   . Feeling of Stress :   Social Connections:   . Frequency of Communication with Friends and Family:   . Frequency of Social Gatherings with Friends and Family:   . Attends Religious Services:   . Active Member of Clubs or Organizations:   . Attends Banker Meetings:   Marland Kitchen Marital Status:      Vital Signs: There were no vitals taken for this visit.  Physical Exam Skin:    General: Skin is warm and dry.     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 Previous appendectomy. EXAM: CT ABDOMEN AND PELVIS WITH CONTRAST TECHNIQUE: Multidetector CT imaging of the abdomen and pelvis was performed using the standard protocol following bolus administration of intravenous contrast. CONTRAST:  169mL ISOVUE-300 IOPAMIDOL (ISOVUE-300) INJECTION 61% COMPARISON:  01/07/2020 FINDINGS: Lower chest: No acute abnormality. Hepatobiliary: Diffuse fatty infiltration of liver as before. No focal hepatic abnormality intrahepatic biliary dilatation. Gallbladder is collapsed. Common bile duct nondilated.  Pancreas: Unremarkable. No pancreatic ductal dilatation or surrounding inflammatory changes. Spleen: Normal in size without focal abnormality. Adrenals/Urinary Tract: Adrenal glands are unremarkable. Kidneys are normal, without renal calculi, focal lesion, or hydronephrosis. Bladder is unremarkable. Stomach/Bowel: Negative for bowel obstruction, significant dilatation, ileus, or free air. Scattered colonic diverticulosis. Stable right lower quadrant drain along the cecum. Resolved right lower quadrant pericecal abscess. Improvement in the inflammation/edema in the right pericolic gutter. No new fluid collections. Vascular/Lymphatic: Intact aorta. Negative for aneurysm. Mesenteric and renal vasculature appear patent. No veno-occlusive process. No bulky adenopathy. Reproductive: No significant finding by CT Other: No abdominal wall hernia or abnormality. No abdominopelvic ascites. Musculoskeletal: L5-S1 degenerative change. No acute osseous finding. IMPRESSION: Resolved right lower quadrant abscess. Stable drain catheter position. Improvement in the pericecal inflammation. No new fluid collections in the abdomen or pelvis. Hepatic steatosis Electronically Signed   By: Jerilynn Mages.  Shick M.D.   On: 01/17/2020 14:09    Labs:  CBC: Recent Labs    01/06/20 0634 01/07/20 0632 01/08/20 0445 01/09/20 0550  WBC 12.0* 9.7 6.7 6.5  HGB 14.5 13.8 13.1 12.9*  HCT 41.1 38.9* 36.2* 35.2*  PLT 137* 149* 168 225    COAGS: No results for input(s): INR, APTT in the last 8760 hours.  BMP: Recent Labs    01/04/20 1908 01/05/20 0521 01/06/20 0634 01/08/20 0445  NA 135 136 137 138  K 3.4* 3.3* 3.3* 3.5  CL 102 102 104 105  CO2 22 25 23 25   GLUCOSE 136* 100* 94 87  BUN 11 15 14 8   CALCIUM 8.9 8.4* 8.2* 8.3*  CREATININE 0.80 0.78 0.69 0.78  GFRNONAA >60 >60 >60 >60  GFRAA >60 >60 >60 >60    LIVER FUNCTION TESTS: Recent Labs    01/04/20 1908 01/05/20 0521 01/06/20 0634 01/08/20 0445  BILITOT 3.5* 3.9*  3.1* 2.1*  AST 57* 40 25 37  ALT 73* 56* 30 30  ALKPHOS 64 57 59 62  PROT 7.9 7.2 6.7 6.8  ALBUMIN 4.2 3.6 3.2* 3.1*    Assessment:  Diverticular abscess-- drain in place CT shows much improved collection but 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 Annamaria Boots attestation of this note for management and plan.

## 2020-01-23 ENCOUNTER — Other Ambulatory Visit: Payer: Self-pay

## 2020-01-24 ENCOUNTER — Inpatient Hospital Stay: Payer: Self-pay | Admitting: Surgery

## 2020-01-29 ENCOUNTER — Ambulatory Visit (INDEPENDENT_AMBULATORY_CARE_PROVIDER_SITE_OTHER): Payer: Self-pay | Admitting: Surgery

## 2020-01-29 ENCOUNTER — Encounter: Payer: Self-pay | Admitting: Surgery

## 2020-01-29 ENCOUNTER — Other Ambulatory Visit: Payer: Self-pay

## 2020-01-29 VITALS — BP 147/90 | HR 96 | Temp 97.9°F | Resp 12 | Ht 68.0 in | Wt 244.2 lb

## 2020-01-29 DIAGNOSIS — K5720 Diverticulitis of large intestine with perforation and abscess without bleeding: Secondary | ICD-10-CM

## 2020-01-29 NOTE — Patient Instructions (Addendum)
Keep your appointment with the Radiologist on Wednesday 01/31/20.  We will see you in two weeks for a follow up, see your appointment below.  Please call the office if you have any questions or concerns.   Diverticulitis  Diverticulitis is when small pockets in your large intestine (colon) get infected or swollen. This causes stomach pain and watery poop (diarrhea). These pouches are called diverticula. They form in people who have a condition called diverticulosis. Follow these instructions at home: Medicines  Take over-the-counter and prescription medicines only as told by your doctor. These include: ? Antibiotics. ? Pain medicines. ? Fiber pills. ? Probiotics. ? Stool softeners.  Do not drive or use heavy machinery while taking prescription pain medicine.  If you were prescribed an antibiotic, take it as told. Do not stop taking it even if you feel better. General instructions   Follow a diet as told by your doctor.  When you feel better, your doctor may tell you to change your diet. You may need to eat a lot of fiber. Fiber makes it easier to poop (have bowel movements). Healthy foods with fiber include: ? Berries. ? Beans. ? Lentils. ? Green vegetables.  Exercise 3 or more times a week. Aim for 30 minutes each time. Exercise enough to sweat and make your heart beat faster.  Keep all follow-up visits as told. This is important. You may need to have an exam of the large intestine. This is called a colonoscopy. Contact a doctor if:  Your pain does not get better.  You have a hard time eating or drinking.  You are not pooping like normal. Get help right away if:  Your pain gets worse.  Your problems do not get better.  Your problems get worse very fast.  You have a fever.  You throw up (vomit) more than one time.  You have poop that is: ? Bloody. ? Black. ? Tarry. Summary  Diverticulitis is when small pockets in your large intestine (colon) get infected or  swollen.  Take medicines only as told by your doctor.  Follow a diet as told by your doctor. This information is not intended to replace advice given to you by your health care provider. Make sure you discuss any questions you have with your health care provider. Document Revised: 08/27/2017 Document Reviewed: 10/01/2016 Elsevier Patient Education  2020 [employer].

## 2020-01-29 NOTE — Progress Notes (Signed)
01/29/2020  History of Present Illness: Jacob Huerta is a 29 y.o. male admitted on 4/8 with a RLQ abscess, likely from cecal diverticulitis.  It underwent percutaneous drainage on 4/12 with IR and he was discharged home on 4/14.  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  Drain was kept in place and transitioned to gravity drainage from bulb suction.  He presents today for follow up.  He already completed his antibiotic course.  He reports he gets occasional discomfort or soreness in the RLQ depending on how he moves.  Denies any nausea or vomiting, diarrhea or constipation.  The drain has had very minimal output since it was converted to gravity drainage and has not had to empty the bag yet.  Past Medical History: Past Medical History:  Diagnosis Date  . Acute appendicitis   . Asthma   . 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 IR RADIOLOGIST EVAL & MGMT  01/17/2020  . LAPAROSCOPIC APPENDECTOMY N/A 09/27/2019   Procedure: APPENDECTOMY LAPAROSCOPIC;  Surgeon: Benjamine Sprague, DO;  Location: [facility] ORS;  Service: General;  Laterality: N/A;  . OTHER SURGICAL HISTORY     jaw surgery, rotator cuff surgery    Home Medications: Prior to Admission medications   Medication Sig Start Date End Date Taking? Authorizing Provider  albuterol (PROVENTIL HFA;VENTOLIN HFA) 108 (90 BASE) MCG/ACT inhaler Inhale 2 puffs into the lungs every 6 (six) hours as needed. For breathing    [provider]  oxyCODONE (OXY IR/ROXICODONE) 5 MG immediate release tablet Take 1 tablet (5 mg total) by mouth every 6 (six) hours as needed for severe pain or breakthrough pain. 01/10/20   Tylene Fantasia, PA-C    Allergies: Allergies  Allergen Reactions  . Uncaria  Tomentosa (Cats Claw) Itching  . Hydrocodone     Vomiting, itching    Review of Systems: Review of Systems  Constitutional: Negative for chills and fever.  Respiratory: Negative for shortness of breath.   Cardiovascular: Negative for chest pain.  Gastrointestinal: Negative for abdominal pain, nausea and vomiting.  Skin: Negative for rash.    Physical Exam BP (!) 147/90   Pulse 96   Temp 97.9 F (36.6 C)   Resp 12   Ht 5\' 8"  (1.727 m)   Wt 244 lb 3.2 oz (110.8 kg)   SpO2 97%   BMI 37.13 kg/m  CONSTITUTIONAL: No acute distress HEENT:  Normocephalic, atraumatic, extraocular motion intact. RESPIRATORY:  Lungs are clear, and breath sounds are equal bilaterally. Normal respiratory effort without pathologic use of accessory muscles. CARDIOVASCULAR: Heart is regular without murmurs, gallops, or rubs. GI: The abdomen is soft, non-distended, non-tender to palpation except for some soreness in the RLQ near the drain.  Drain is in place, secured, with minimal seropurulent fluid in the bag. NEUROLOGIC:  Motor and sensation is grossly normal.  Cranial nerves are grossly intact. PSYCH:  Alert and oriented to person, place and time. Affect is normal.  Labs/Imaging: CT scan abd/pelvis 01/17/20: IMPRESSION: Resolved right lower quadrant abscess. Stable drain catheter position. Improvement in the pericecal inflammation.  No new fluid collections in the abdomen or pelvis.  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 Discontinue flushing. Abscess is resolved by CT from earlier today.  Assessment and Plan: This is a 29 y.o. 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  --Discussed with the patient that the minimal drainage from the drain is reassuring.  My hope is that this fistula is  closing on its own and that the next drain study on 01/31/20 is improved.   --For now, we will set up an appointment for two weeks.  If the fistula has resolved, it is possible that the drain is removed on 5/5, and then we would see how he is doing without a drain and check on his progress.  However, if the fistula persists, then we would change the follow up according to what the next follow up with radiology would be.  If the fistula persists and no further studies are recommended, then the discussion would be for surgery in the future to remove the area of diverticulitis and the fistula.     Face-to-face time spent with the patient and care providers was 15 minutes, with more than 50% of the time spent counseling, educating, and coordinating care of the patient.     [name], MD [facility]

## 2020-01-31 ENCOUNTER — Encounter: Payer: Self-pay | Admitting: Radiology

## 2020-01-31 ENCOUNTER — Ambulatory Visit
Admission: RE | Admit: 2020-01-31 | Discharge: 2020-01-31 | Disposition: A | Discharge status: Home / Self Care | Payer: Self-pay | Source: Ambulatory Visit | Attending: Surgery | Admitting: Surgery

## 2020-01-31 DIAGNOSIS — K651 Peritoneal abscess: Secondary | ICD-10-CM

## 2020-01-31 HISTORY — PX: IR RADIOLOGIST EVAL & MGMT: IMG5224

## 2020-01-31 NOTE — Progress Notes (Signed)
Referring Physician(s): Dr Olean Ree  Chief Complaint: The patient is seen in follow up today s/p Divertic abscess drain placed in IR 01/08/20  History of present illness:   Previous Hx appendectomy 09/24/19 Developed abd pain and N/V weeks later Imaging revealed new abd abscess-- presumed diverticulitis Drain placed in IR 01/08/20  Last visit to [facility] 01/17/20: resolution of abscess on CT But +fistula per injection Drain was changed to gravity bag and flushes stopped  Since then,  pt has had very little OP Amount in bag is ~20 cc--- the total from last 2 weeks  Denies pain;fever Denies chills   Scheduled today for injection and evaluation  Pt was just seen by Dr Hampton Abbot in [facility] Note reflects that pt is doing well Finished antibiotics 4/24 Plan for 2 week visit with Surgery and discuss plans   Past Medical History:  Diagnosis Date  . Acute appendicitis   . Asthma   . 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 IR RADIOLOGIST EVAL & MGMT  01/17/2020  . IR RADIOLOGIST EVAL & MGMT  01/31/2020  . LAPAROSCOPIC APPENDECTOMY N/A 09/27/2019   Procedure: APPENDECTOMY LAPAROSCOPIC;  Surgeon: Benjamine Sprague, DO;  Location: [facility] ORS;  Service: General;  Laterality: N/A;  . OTHER SURGICAL HISTORY     jaw surgery, rotator cuff surgery    Allergies: Uncaria tomentosa (cats claw) and Hydrocodone  Medications: Prior to Admission medications   Medication Sig Start Date End Date Taking? Authorizing Provider  albuterol (PROVENTIL HFA;VENTOLIN HFA) 108 (90 BASE) MCG/ACT inhaler Inhale 2 puffs into the lungs every 6 (six) hours as needed. For breathing    [provider]     No family history on file.  Social History   Socioeconomic History  . Marital status: Single    Spouse name: Not on file  . Number of children: Not on file  . Years of education: Not on file  . Highest  education level: Not on file  Occupational History  . Not on file  Tobacco Use  . Smoking status: Current Every Day Smoker    Packs/day: 0.25    Types: Cigarettes  . Smokeless tobacco: Never Used  Substance and Sexual Activity  . Alcohol use: Not Currently    Comment: occ  . Drug use: No  . Sexual activity: Not on file  Other Topics Concern  . Not on file  Social History Narrative  . Not on file   Social Determinants of Health   Financial Resource Strain:   . Difficulty of Paying Living Expenses:   Food Insecurity:   . Worried About Charity fundraiser in the Last Year:   . Arboriculturist in the Last Year:   Transportation Needs:   . Film/video editor (Medical):   Marland Kitchen Lack of Transportation (Non-Medical):   Physical Activity:   . Days of Exercise per Week:   . Minutes of Exercise per Session:   Stress:   . Feeling of Stress :   Social Connections:   . Frequency of Communication with Friends and Family:   . Frequency of Social Gatherings with Friends and Family:   . Attends Religious Services:   . Active Member of Clubs or Organizations:   . Attends Archivist Meetings:   Marland Kitchen Marital Status:      Vital Signs: There were no vitals taken for this visit.  Physical Exam  Vitals reviewed.  Skin:    General: Skin is warm and dry.     Comments: Site has areas of redness under tape/stat lock No sign of infection NT  Injection shows resolution of fistula per Dr Lanell Persons removed and dressing placed per Dr Grace Isaac     Imaging: IR Radiologist Eval & Mgmt  Result Date: 01/31/2020 Please refer to notes tab for details about interventional procedure. (Op Note)   Labs:  CBC: Recent Labs    01/06/20 0634 01/07/20 0632 01/08/20 0445 01/09/20 0550  WBC 12.0* 9.7 6.7 6.5  HGB 14.5 13.8 13.1 12.9*  HCT 41.1 38.9* 36.2* 35.2*  PLT 137* 149* 168 225    COAGS: No results for input(s): INR, APTT in the last 8760 hours.  BMP: Recent Labs     01/04/20 1908 01/05/20 0521 01/06/20 0634 01/08/20 0445  NA 135 136 137 138  K 3.4* 3.3* 3.3* 3.5  CL 102 102 104 105  CO2 22 25 23 25   GLUCOSE 136* 100* 94 87  BUN 11 15 14 8   CALCIUM 8.9 8.4* 8.2* 8.3*  CREATININE 0.80 0.78 0.69 0.78  GFRNONAA >60 >60 >60 >60  GFRAA >60 >60 >60 >60    LIVER FUNCTION TESTS: Recent Labs    01/04/20 1908 01/05/20 0521 01/06/20 0634 01/08/20 0445  BILITOT 3.5* 3.9* 3.1* 2.1*  AST 57* 40 25 37  ALT 73* 56* 30 30  ALKPHOS 64 57 59 62  PROT 7.9 7.2 6.7 6.8  ALBUMIN 4.2 3.6 3.2* 3.1*    Assessment:  Resolution of RLQ abscess and resolution of fistula to bowel Drain removed and dressing placed Follow up with Dr 03/07/20 2 weeks   Signed: 03/09/20, PA-C 01/31/2020, 1:30 PM   Please refer to Dr. Robet Leu attestation of this note for management and plan.

## 2020-02-12 ENCOUNTER — Other Ambulatory Visit: Payer: Self-pay

## 2020-02-12 ENCOUNTER — Encounter: Payer: Self-pay | Admitting: Surgery

## 2020-02-12 ENCOUNTER — Ambulatory Visit (INDEPENDENT_AMBULATORY_CARE_PROVIDER_SITE_OTHER): Payer: Self-pay | Admitting: Surgery

## 2020-02-12 VITALS — BP 152/89 | HR 86 | Temp 97.5°F | Ht 68.0 in | Wt 247.0 lb

## 2020-02-12 DIAGNOSIS — K5720 Diverticulitis of large intestine with perforation and abscess without bleeding: Secondary | ICD-10-CM

## 2020-02-12 NOTE — Progress Notes (Signed)
02/12/2020  History of Present Illness: Jacob Huerta is a 29 y.o. male presenting for follow-up after cecal diverticulitis with right lower quadrant abscess on 01/04/2020.  He required percutaneous drainage which was placed on 4/12.  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  His drain was removed at that point.  He presents now for follow-up.  He reports that on Saturday he started having soreness in his right lower quadrant and also in his right lower back.  He thinks that this may be musculoskeletal in nature as he has been doing heavy lifting and twisting at work and he also reports lifting a friend over the weekend.  Past Medical History: Past Medical History:  Diagnosis Date  . Acute appendicitis   . Asthma   . 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 IR RADIOLOGIST EVAL & MGMT  01/17/2020  . IR RADIOLOGIST EVAL & MGMT  01/31/2020  . LAPAROSCOPIC APPENDECTOMY N/A 09/27/2019   Procedure: APPENDECTOMY LAPAROSCOPIC;  Surgeon: Sung Amabile, DO;  Location: [facility] ORS;  Service: General;  Laterality: N/A;  . OTHER SURGICAL HISTORY     jaw surgery, rotator cuff surgery    Home Medications: Prior to Admission medications   Medication Sig Start Date End Date Taking? Authorizing Provider  albuterol (PROVENTIL HFA;VENTOLIN HFA) 108 (90 BASE) MCG/ACT inhaler Inhale 2 puffs into the lungs every 6 (six) hours as needed. For breathing   Yes [provider]    Allergies: Allergies  Allergen Reactions  . Uncaria Tomentosa (Cats Claw) Itching  . Hydrocodone     Vomiting, itching    Review of Systems: Review of Systems  Constitutional: Negative for chills and fever.  Respiratory: Negative for shortness of breath.   Cardiovascular: Negative for chest pain.   Gastrointestinal: Positive for abdominal pain (RLQ). Negative for constipation, diarrhea, nausea and vomiting.  Musculoskeletal: Positive for back pain (right lower back).    Physical Exam BP (!) 152/89   Pulse 86   Temp (!) 97.5 F (36.4 C) (Temporal)   Ht 5\' 8"  (1.727 m)   Wt 247 lb (112 kg)   SpO2 95%   BMI 37.56 kg/m  CONSTITUTIONAL: No acute distress HEENT:  Normocephalic, atraumatic, extraocular motion intact. RESPIRATORY:  Lungs are clear, and breath sounds are equal bilaterally. Normal respiratory effort without pathologic use of accessory muscles. CARDIOVASCULAR: Heart is regular without murmurs, gallops, or rubs. GI: The abdomen is soft, non-distended, with some soreness to palpation in the right lower quadrant, but no peritoneal signs.  Musculoskeletal:  Soreness in the right lower back. NEUROLOGIC:  Motor and sensation is grossly normal.  Cranial nerves are grossly intact. PSYCH:  Alert and oriented to person, place and time. Affect is normal.  Labs/Imaging: None recently  Assessment and Plan: This is a 29 y.o. male with hx of cecal diverticulitis, s/p percutaneous drain, with fistula noted on prior drain study, since resolved and drain removed.  --Discussed with the patient that although this could very well be just musculoskeletal related, I do want to be cautious and monitor his symptoms over the next few days.  He can do heating pad to his back for comfort, take Ibuprofen for the muscle pain as needed.  But if the pain is worsening, spreading, or other symptoms,  he should let us know right away for further evaluation. --Otherwise, if he does improve with conservative measures, then we'll set up a referral for GI for colonoscopy for early July, and then follow up with me for discussion of results and possible surgery.  Face-to-face time spent with the patient and care providers was 15 minutes, with more than 50% of the time spent counseling, educating, and  coordinating care of the patient.     Melvyn Neth, [facility]

## 2020-02-12 NOTE — Patient Instructions (Addendum)
Dr.Piscoya advised patient to give our office a call if he noticed symptoms worsen such as fever,chills or nausea. Dr.Piscoya recommends patient to try Ibuprofen to help with the inflammation and back pain.  Diverticulitis  Diverticulitis is infection or inflammation of small pouches (diverticula) in the colon that form due to a condition called diverticulosis. Diverticula can trap stool (feces) and bacteria, causing infection and inflammation. Diverticulitis may cause severe stomach pain and diarrhea. It may lead to tissue damage in the colon that causes bleeding. The diverticula may also burst (rupture) and cause infected stool to enter other areas of the abdomen. Complications of diverticulitis can include:  Bleeding.  Severe infection.  Severe pain.  Rupture (perforation) of the colon.  Blockage (obstruction) of the colon. What are the causes? This condition is caused by stool becoming trapped in the diverticula, which allows bacteria to grow in the diverticula. This leads to inflammation and infection. What increases the risk? You are more likely to develop this condition if:  You have diverticulosis. The risk for diverticulosis increases if: ? You are overweight or obese. ? You use tobacco products. ? You do not get enough exercise.  You eat a diet that does not include enough fiber. High-fiber foods include fruits, vegetables, beans, nuts, and whole grains. What are the signs or symptoms? Symptoms of this condition may include:  Pain and tenderness in the abdomen. The pain is normally located on the left side of the abdomen, but it may occur in other areas.  Fever and chills.  Bloating.  Cramping.  Nausea.  Vomiting.  Changes in bowel routines.  Blood in your stool. How is this diagnosed? This condition is diagnosed based on:  Your medical history.  A physical exam.  Tests to make sure there is nothing else causing your condition. These tests may  include: ? Blood tests. ? Urine tests. ? Imaging tests of the abdomen, including X-rays, ultrasounds, MRIs, or CT scans. How is this treated? Most cases of this condition are mild and can be treated at home. Treatment may include:  Taking over-the-counter pain medicines.  Following a clear liquid diet.  Taking antibiotic medicines by mouth.  Rest. More severe cases may need to be treated at a hospital. Treatment may include:  Not eating or drinking.  Taking prescription pain medicine.  Receiving antibiotic medicines through an IV tube.  Receiving fluids and nutrition through an IV tube.  Surgery. When your condition is under control, your health care provider may recommend that you have a colonoscopy. This is an exam to look at the entire large intestine. During the exam, a lubricated, bendable tube is inserted into the anus and then passed into the rectum, colon, and other parts of the large intestine. A colonoscopy can show how severe your diverticula are and whether something else may be causing your symptoms. Follow these instructions at home: Medicines  Take over-the-counter and prescription medicines only as told by your health care provider. These include fiber supplements, probiotics, and stool softeners.  If you were prescribed an antibiotic medicine, take it as told by your health care provider. Do not stop taking the antibiotic even if you start to feel better.  Do not drive or use heavy machinery while taking prescription pain medicine. General instructions   Follow a full liquid diet or another diet as directed by your health care provider. After your symptoms improve, your health care provider may tell you to change your diet. He or she may recommend that you  eat a diet that contains at least 25 g (25 grams) of fiber daily. Fiber makes it easier to pass stool. Healthy sources of fiber include: ? Berries. One cup contains 4-8 grams of fiber. ? Beans or lentils. One  half cup contains 5-8 grams of fiber. ? Green vegetables. One cup contains 4 grams of fiber.  Exercise for at least 30 minutes, 3 times each week. You should exercise hard enough to raise your heart rate and break a sweat.  Keep all follow-up visits as told by your health care provider. This is important. You may need a colonoscopy. Contact a health care provider if:  Your pain does not improve.  You have a hard time drinking or eating food.  Your bowel movements do not return to normal. Get help right away if:  Your pain gets worse.  Your symptoms do not get better with treatment.  Your symptoms suddenly get worse.  You have a fever.  You vomit more than one time.  You have stools that are bloody, black, or tarry. Summary  Diverticulitis is infection or inflammation of small pouches (diverticula) in the colon that form due to a condition called diverticulosis. Diverticula can trap stool (feces) and bacteria, causing infection and inflammation.  You are at higher risk for this condition if you have diverticulosis and you eat a diet that does not include enough fiber.  Most cases of this condition are mild and can be treated at home. More severe cases may need to be treated at a hospital.  When your condition is under control, your health care provider may recommend that you have an exam called a colonoscopy. This exam can show how severe your diverticula are and whether something else may be causing your symptoms. This information is not intended to replace advice given to you by your health care provider. Make sure you discuss any questions you have with your health care provider. Document Revised: 08/27/2017 Document Reviewed: 10/17/2016 Elsevier Patient Education  2020 [employer].

## 2020-02-19 ENCOUNTER — Telehealth: Payer: Self-pay | Admitting: *Deleted

## 2020-02-19 NOTE — Telephone Encounter (Signed)
error 

## 2020-02-21 ENCOUNTER — Ambulatory Visit (INDEPENDENT_AMBULATORY_CARE_PROVIDER_SITE_OTHER): Payer: Self-pay | Admitting: Surgery

## 2020-02-21 ENCOUNTER — Encounter: Payer: Self-pay | Admitting: Surgery

## 2020-02-21 ENCOUNTER — Other Ambulatory Visit: Payer: Self-pay

## 2020-02-21 VITALS — BP 138/90 | HR 90 | Temp 97.7°F | Resp 12 | Wt 250.4 lb

## 2020-02-21 DIAGNOSIS — K5720 Diverticulitis of large intestine with perforation and abscess without bleeding: Secondary | ICD-10-CM

## 2020-02-21 DIAGNOSIS — R109 Unspecified abdominal pain: Secondary | ICD-10-CM

## 2020-02-21 NOTE — Progress Notes (Signed)
02/21/2020  History of Present Illness: Jacob Huerta is a 29 y.o. male with a history of cecal diverticulitis with abscess requiring drainage on 01/08/20.  On follow up imaging, he had developed a small fistula, which then resolved on repeat drain study on 5/5 and drain was removed.  He was last seen on 5/15 because he had started having some soreness in the RLQ around the same area as before.  He had just done a lot of heavy activity and he thought was more musculoskeletal in nature.  He did not have any fevers or chills, the pain was not worsening, so we decided on watchful waiting.  He reports the pain subsided and got better, but then again started over this past weekend, about 3-4 days ago.  He called the office on 5/24 because of the pain recurrence and presents now for follow up.  He denies any fevers or chills, and reports the same type of soreness as on his last visit, but this time around he has not done anything too physical that would account for the discomfort.  Past Medical History: Past Medical History:  Diagnosis Date  . Acute appendicitis   . Asthma   . 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 IR RADIOLOGIST EVAL & MGMT  01/17/2020  . IR RADIOLOGIST EVAL & MGMT  01/31/2020  . LAPAROSCOPIC APPENDECTOMY N/A 09/27/2019   Procedure: APPENDECTOMY LAPAROSCOPIC;  Surgeon: Sung Amabile, DO;  Location: [facility] ORS;  Service: General;  Laterality: N/A;  . OTHER SURGICAL HISTORY     jaw surgery, rotator cuff surgery    Home Medications: Prior to Admission medications   Medication Sig Start Date End Date Taking? Authorizing Provider  albuterol (PROVENTIL HFA;VENTOLIN HFA) 108 (90 BASE) MCG/ACT inhaler Inhale 2 puffs into the lungs every 6 (six) hours as needed. For breathing   Yes [provider]    Allergies: Allergies  Allergen Reactions  . Uncaria Tomentosa  (Cats Claw) Itching  . Hydrocodone     Vomiting, itching    Review of Systems: Review of Systems  Constitutional: Negative for chills and fever.  Respiratory: Negative for shortness of breath.   Cardiovascular: Negative for chest pain.  Gastrointestinal: Positive for abdominal pain. Negative for nausea and vomiting.    Physical Exam BP 138/90   Pulse 90   Temp 97.7 F (36.5 C)   Resp 12   Wt 250 lb 6.4 oz (113.6 kg)   SpO2 97%   BMI 38.07 kg/m  CONSTITUTIONAL: No acute distress HEENT:  Normocephalic, atraumatic, extraocular motion intact. RESPIRATORY:  Lungs are clear, and breath sounds are equal bilaterally. Normal respiratory effort without pathologic use of accessory muscles. CARDIOVASCULAR: Heart is regular without murmurs, gallops, or rubs. GI: The abdomen is soft, non-distended, with soreness to palpation in the right side of his abdomen, more at the level of the umbilicus, on the right side.  This is just superior to prior area of pain.  The prior drain site looks well healed, without any evidence of infection.  Negative Murphy's sign. NEUROLOGIC:  Motor and sensation is grossly normal.  Cranial nerves are grossly intact. PSYCH:  Alert and oriented to person, place and time. Affect is normal.  Assessment and Plan: This is a 29 y.o. male with cecal diverticulitis with abscess requiring drainage on 4/12, with subsequent complication of fistula which resolved.  --Discussed with the patient that  now that he's had two of these episodes of soreness/discomfort, I think it's better to be cautious and safe and obtain a repeat CT scan of abdomen/pelvis with contrast to evaluate the RLQ area. --I will call patient with the results.  If it's just residual inflammation or scar tissue that's causing this discomfort, then we can continue with plans for colonoscopy in early July and follow up then.  Otherwise, may need antibiotics vs other procedures, and that would delay  colonoscopy.  Face-to-face time spent with the patient and care providers was 25 minutes, with more than 50% of the time spent counseling, educating, and coordinating care of the patient.     Melvyn Neth, [facility]

## 2020-02-21 NOTE — Patient Instructions (Addendum)
Patient has been scheduled for a CT Abdomen Pelvis with Contrast at [facility] for June 7th at 3:00pm with arrival time at 2:45p.        [facility]     [address] [place], [place] [address]  ~18.7 mi        [phone]  Prep: NPO 4 hours prior and pick up prep kit. Patient verbalizes understanding.  Dr.Piscoya will contact you once your CT results are received via telephone.     Diverticulitis  Diverticulitis is infection or inflammation of small pouches (diverticula) in the colon that form due to a condition called diverticulosis. Diverticula can trap stool (feces) and bacteria, causing infection and inflammation. Diverticulitis may cause severe stomach pain and diarrhea. It may lead to tissue damage in the colon that causes bleeding. The diverticula may also burst (rupture) and cause infected stool to enter other areas of the abdomen. Complications of diverticulitis can include:  Bleeding.  Severe infection.  Severe pain.  Rupture (perforation) of the colon.  Blockage (obstruction) of the colon. What are the causes? This condition is caused by stool becoming trapped in the diverticula, which allows bacteria to grow in the diverticula. This leads to inflammation and infection. What increases the risk? You are more likely to develop this condition if:  You have diverticulosis. The risk for diverticulosis increases if: ? You are overweight or obese. ? You use tobacco products. ? You do not get enough exercise.  You eat a diet that does not include enough fiber. High-fiber foods include fruits, vegetables, beans, nuts, and whole grains. What are the signs or symptoms? Symptoms of this condition may include:  Pain and tenderness in the abdomen. The pain is normally located on the left side of the abdomen, but it may occur in other areas.  Fever and chills.  Bloating.  Cramping.  Nausea.  Vomiting.  Changes in bowel  routines.  Blood in your stool. How is this diagnosed? This condition is diagnosed based on:  Your medical history.  A physical exam.  Tests to make sure there is nothing else causing your condition. These tests may include: ? Blood tests. ? Urine tests. ? Imaging tests of the abdomen, including X-rays, ultrasounds, MRIs, or CT scans. How is this treated? Most cases of this condition are mild and can be treated at home. Treatment may include:  Taking over-the-counter pain medicines.  Following a clear liquid diet.  Taking antibiotic medicines by mouth.  Rest. More severe cases may need to be treated at a hospital. Treatment may include:  Not eating or drinking.  Taking prescription pain medicine.  Receiving antibiotic medicines through an IV tube.  Receiving fluids and nutrition through an IV tube.  Surgery. When your condition is under control, your health care provider may recommend that you have a colonoscopy. This is an exam to look at the entire large intestine. During the exam, a lubricated, bendable tube is inserted into the anus and then passed into the rectum, colon, and other parts of the large intestine. A colonoscopy can show how severe your diverticula are and whether something else may be causing your symptoms. Follow these instructions at home: Medicines  Take over-the-counter and prescription medicines only as told by your health care provider. These include fiber supplements, probiotics, and stool softeners.  If you were prescribed an antibiotic medicine, take it as told by your health care provider. Do not stop taking the antibiotic even if you start  to feel better.  Do not drive or use heavy machinery while taking prescription pain medicine. General instructions   Follow a full liquid diet or another diet as directed by your health care provider. After your symptoms improve, your health care provider may tell you to change your diet. He or she may  recommend that you eat a diet that contains at least 25 g (25 grams) of fiber daily. Fiber makes it easier to pass stool. Healthy sources of fiber include: ? Berries. One cup contains 4-8 grams of fiber. ? Beans or lentils. One half cup contains 5-8 grams of fiber. ? Green vegetables. One cup contains 4 grams of fiber.  Exercise for at least 30 minutes, 3 times each week. You should exercise hard enough to raise your heart rate and break a sweat.  Keep all follow-up visits as told by your health care provider. This is important. You may need a colonoscopy. Contact a health care provider if:  Your pain does not improve.  You have a hard time drinking or eating food.  Your bowel movements do not return to normal. Get help right away if:  Your pain gets worse.  Your symptoms do not get better with treatment.  Your symptoms suddenly get worse.  You have a fever.  You vomit more than one time.  You have stools that are bloody, black, or tarry. Summary  Diverticulitis is infection or inflammation of small pouches (diverticula) in the colon that form due to a condition called diverticulosis. Diverticula can trap stool (feces) and bacteria, causing infection and inflammation.  You are at higher risk for this condition if you have diverticulosis and you eat a diet that does not include enough fiber.  Most cases of this condition are mild and can be treated at home. More severe cases may need to be treated at a hospital.  When your condition is under control, your health care provider may recommend that you have an exam called a colonoscopy. This exam can show how severe your diverticula are and whether something else may be causing your symptoms. This information is not intended to replace advice given to you by your health care provider. Make sure you discuss any questions you have with your health care provider. Document Revised: 08/27/2017 Document Reviewed: 10/17/2016 Elsevier  Patient Education  2020 [employer].

## 2020-02-29 ENCOUNTER — Other Ambulatory Visit: Payer: Self-pay

## 2020-02-29 ENCOUNTER — Ambulatory Visit
Admission: RE | Admit: 2020-02-29 | Discharge: 2020-02-29 | Disposition: A | Discharge status: Home / Self Care | Payer: Self-pay | Source: Ambulatory Visit | Attending: Surgery | Admitting: Surgery

## 2020-02-29 DIAGNOSIS — K5720 Diverticulitis of large intestine with perforation and abscess without bleeding: Secondary | ICD-10-CM | POA: Insufficient documentation

## 2020-02-29 MED ORDER — IOHEXOL 300 MG/ML  SOLN
100.0000 mL | Freq: Once | INTRAMUSCULAR | Status: AC | PRN
  Administered 2020-02-29: 100 mL via INTRAVENOUS

## 2020-03-04 ENCOUNTER — Ambulatory Visit: Payer: Self-pay

## 2020-04-17 ENCOUNTER — Ambulatory Visit (INDEPENDENT_AMBULATORY_CARE_PROVIDER_SITE_OTHER): Payer: 59 | Admitting: Gastroenterology

## 2020-04-17 ENCOUNTER — Other Ambulatory Visit: Payer: Self-pay

## 2020-04-17 VITALS — BP 132/82 | HR 80 | Temp 97.8°F | Ht 68.0 in | Wt 251.0 lb

## 2020-04-17 DIAGNOSIS — K5720 Diverticulitis of large intestine with perforation and abscess without bleeding: Secondary | ICD-10-CM

## 2020-04-17 MED ORDER — NA SULFATE-K SULFATE-MG SULF 17.5-3.13-1.6 GM/177ML PO SOLN: ORAL | 0 refills | Status: DC

## 2020-04-17 NOTE — Addendum Note (Signed)
Addended by: Adela Ports on: 04/17/2020 10:16 AM   Modules accepted: Orders

## 2020-04-17 NOTE — Progress Notes (Signed)
[name] MD, MRCP(U.K) [address]  [address]  [place], [place] [address]  Main: [phone]  Fax: [phone]   Gastroenterology Consultation  Referring Provider:     Henrene Dodge, MD Primary Care Physician:  Georgann Housekeeper, MD Primary Gastroenterologist:  Dr. [name]  Reason for Consultation: Diverticulitis        HPI:   Jacob Huerta is a 29 y.o. y/o male referred by Dr. Aleen Campi.  He was admitted in April 2021 with right lower quadrant abscess likely from cecal diverticulitis.  A percutaneous drain was placed and was discharged home.  Subsequent resolution of the abscess was noted.  A fistula was noted to the cecum.  Completed a course of antibiotics.  Repeat CT scan on 03/01/2020 demonstrates diverticulosis of the ascending colon and cecum.  Mildly prominent wall of the proximal colon.  No clear fistula noted at this point of time.  Referred for evaluation.  Never had a colonoscopy.  Presently has no complaints.  Past Medical History:  Diagnosis Date  . Acute appendicitis   . Asthma   . 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 IR RADIOLOGIST EVAL & MGMT  01/17/2020  . IR RADIOLOGIST EVAL & MGMT  01/31/2020  . LAPAROSCOPIC APPENDECTOMY N/A 09/27/2019   Procedure: APPENDECTOMY LAPAROSCOPIC;  Surgeon: Sung Amabile, DO;  Location: [facility] ORS;  Service: General;  Laterality: N/A;  . OTHER SURGICAL HISTORY     jaw surgery, rotator cuff surgery    Prior to Admission medications   Medication Sig Start Date End Date Taking? Authorizing Provider  albuterol (PROVENTIL HFA;VENTOLIN HFA) 108 (90 BASE) MCG/ACT inhaler Inhale 2 puffs into the lungs every 6 (six) hours as needed. For breathing    [provider]    No family history on file.   Social History   Tobacco Use  . Smoking status: Current Every Day Smoker    Packs/day: 0.25    Types: Cigarettes  . Smokeless  tobacco: Never Used  Substance Use Topics  . Alcohol use: Not Currently    Comment: occ  . Drug use: No    Allergies as of 04/17/2020 - Review Complete 02/29/2020  Allergen Reaction Noted  . Uncaria tomentosa (cats claw) Itching 10/05/2019  . Hydrocodone  03/08/2012    Review of Systems:    All systems reviewed and negative except where noted in HPI.   Physical Exam:  There were no vitals taken for this visit. No LMP for male patient. Psych:  Alert and cooperative. Normal mood and affect. General:   Alert,  Well-developed, well-nourished, pleasant and cooperative in NAD Head:  Normocephalic and atraumatic. Eyes:  Sclera clear, no icterus.   Conjunctiva pink. Ears:  Normal auditory acuity. Lungs:  Respirations even and unlabored.  Clear throughout to auscultation.   No wheezes, crackles, or rhonchi. No acute distress. Heart:  Regular rate and rhythm; no murmurs, clicks, rubs, or gallops. Abdomen:  Normal bowel sounds.  No bruits.  Soft, non-tender and non-distended without masses, hepatosplenomegaly or hernias noted.  No guarding or rebound tenderness.    Neurologic:  Alert and oriented x3;  grossly normal neurologically. Psych:  Alert and cooperative. Normal mood and affect.  Imaging Studies: No results found.  Assessment and Plan:   Jacob Huerta is a 29 y.o. y/o male has been referred for evaluation.  Earlier in the year he had a episode of cecal diverticulitis  with abscess and fistula formation.  Treated conservatively with IR guided drainage placement, antibiotics.  Recent CT scan in June 2021 showed resolution of the diverticulitis and no presence of any fistula.  He has not had a prior colonoscopy.  Plan 1.  Diagnostic colonoscopy  I have discussed alternative options, risks & benefits,  which include, but are not limited to, bleeding, infection, perforation,respiratory complication & drug reaction.  The patient agrees with this plan & written consent will be  obtained.     Follow up in as needed   Dr [name] MD,MRCP(U.K)

## 2020-05-13 ENCOUNTER — Other Ambulatory Visit: Payer: Self-pay

## 2020-05-13 ENCOUNTER — Other Ambulatory Visit
Admission: RE | Admit: 2020-05-13 | Discharge: 2020-05-13 | Disposition: A | Discharge status: Home / Self Care | Payer: 59 | Source: Ambulatory Visit | Attending: Gastroenterology | Admitting: Gastroenterology

## 2020-05-13 DIAGNOSIS — Z01812 Encounter for preprocedural laboratory examination: Secondary | ICD-10-CM | POA: Diagnosis present

## 2020-05-13 DIAGNOSIS — Z20822 Contact with and (suspected) exposure to covid-19: Secondary | ICD-10-CM | POA: Insufficient documentation

## 2020-05-13 LAB — SARS CORONAVIRUS 2 (TAT 6-24 HRS): SARS Coronavirus 2: NEGATIVE

## 2020-05-15 ENCOUNTER — Encounter: Payer: Self-pay | Admitting: Gastroenterology

## 2020-05-15 ENCOUNTER — Ambulatory Visit: Payer: 59 | Admitting: Anesthesiology

## 2020-05-15 ENCOUNTER — Ambulatory Visit
Admission: RE | Admit: 2020-05-15 | Discharge: 2020-05-15 | Disposition: A | Discharge status: Home / Self Care | Payer: 59 | Attending: Gastroenterology | Admitting: Gastroenterology

## 2020-05-15 ENCOUNTER — Encounter
Admission: RE | Disposition: A | Discharge status: Home / Self Care | Payer: Self-pay | Source: Home / Self Care | Attending: Gastroenterology

## 2020-05-15 DIAGNOSIS — Z09 Encounter for follow-up examination after completed treatment for conditions other than malignant neoplasm: Secondary | ICD-10-CM | POA: Insufficient documentation

## 2020-05-15 DIAGNOSIS — K635 Polyp of colon: Secondary | ICD-10-CM

## 2020-05-15 DIAGNOSIS — K5730 Diverticulosis of large intestine without perforation or abscess without bleeding: Secondary | ICD-10-CM | POA: Insufficient documentation

## 2020-05-15 DIAGNOSIS — K5720 Diverticulitis of large intestine with perforation and abscess without bleeding: Secondary | ICD-10-CM

## 2020-05-15 DIAGNOSIS — F17210 Nicotine dependence, cigarettes, uncomplicated: Secondary | ICD-10-CM | POA: Insufficient documentation

## 2020-05-15 DIAGNOSIS — Z8719 Personal history of other diseases of the digestive system: Secondary | ICD-10-CM | POA: Insufficient documentation

## 2020-05-15 DIAGNOSIS — Z79899 Other long term (current) drug therapy: Secondary | ICD-10-CM | POA: Insufficient documentation

## 2020-05-15 DIAGNOSIS — K621 Rectal polyp: Secondary | ICD-10-CM | POA: Insufficient documentation

## 2020-05-15 DIAGNOSIS — Z885 Allergy status to narcotic agent status: Secondary | ICD-10-CM | POA: Insufficient documentation

## 2020-05-15 DIAGNOSIS — J45909 Unspecified asthma, uncomplicated: Secondary | ICD-10-CM | POA: Insufficient documentation

## 2020-05-15 HISTORY — PX: COLONOSCOPY WITH PROPOFOL: SHX5780

## 2020-05-15 SURGERY — COLONOSCOPY WITH PROPOFOL
Anesthesia: General

## 2020-05-15 MED ORDER — PROPOFOL 10 MG/ML IV BOLUS
INTRAVENOUS | Status: DC | PRN
  Administered 2020-05-15: 50 mg via INTRAVENOUS
  Administered 2020-05-15 (×2): 100 mg via INTRAVENOUS

## 2020-05-15 MED ORDER — LIDOCAINE HCL (CARDIAC) PF 100 MG/5ML IV SOSY
PREFILLED_SYRINGE | INTRAVENOUS | Status: DC | PRN
  Administered 2020-05-15: 100 mg via INTRAVENOUS

## 2020-05-15 MED ORDER — SODIUM CHLORIDE 0.9 % IV SOLN
INTRAVENOUS | Status: DC
  Administered 2020-05-15: 1000 mL via INTRAVENOUS

## 2020-05-15 MED ORDER — PROPOFOL 500 MG/50ML IV EMUL
INTRAVENOUS | Status: DC | PRN
  Administered 2020-05-15: 130 ug/kg/min via INTRAVENOUS

## 2020-05-15 NOTE — Transfer of Care (Signed)
Immediate Anesthesia Transfer of Care Note  Patient: Jacob Huerta  Procedure(s) Performed: COLONOSCOPY WITH PROPOFOL (N/A )  Patient Location: PACU and Endoscopy Unit  Anesthesia Type:General  Level of Consciousness: awake, drowsy and patient cooperative  Airway & Oxygen Therapy: Patient Spontanous Breathing  Post-op Assessment: Report given to RN and Post -op Vital signs reviewed and stable  Post vital signs: Reviewed and stable  Last Vitals:  Vitals Value Taken Time  BP 127/78 05/15/20 1442  Temp    Pulse 85 05/15/20 1442  Resp 15 05/15/20 1442  SpO2 97 % 05/15/20 1442  Vitals shown include unvalidated device data.  Last Pain:  Vitals:   05/15/20 1307  TempSrc: Temporal      Patients Stated Pain Goal: 0 (05/15/20 1307)  Complications: No complications documented.

## 2020-05-15 NOTE — Op Note (Signed)
[facility] Gastroenterology Patient Name: Jacob Huerta Procedure Date: 05/15/2020 2:18 PM MRN: [id number] Account #: [id number] Date of Birth: 06/17/1991 Admit Type: Outpatient Age: 29 Room: [facility] ENDO ROOM 1 Gender: Male Note Status: Finalized Procedure:             Colonoscopy Indications:           Follow-up of diverticulitis Providers:             [name] MD, MD Referring MD:          Georgann Housekeeper, MD (Referring MD) Medicines:             Monitored Anesthesia Care Complications:         No immediate complications. Procedure:             Pre-Anesthesia Assessment:                        - Prior to the procedure, a History and Physical was                         performed, and patient medications, allergies and                         sensitivities were reviewed. The patient's tolerance                         of previous anesthesia was reviewed.                        - The risks and benefits of the procedure and the                         sedation options and risks were discussed with the                         patient. All questions were answered and informed                         consent was obtained.                        - ASA Grade Assessment: II - A patient with mild                         systemic disease.                        After obtaining informed consent, the colonoscope was                         passed under direct vision. Throughout the procedure,                         the patient's blood pressure, pulse, and oxygen                         saturations were monitored continuously. The                         Colonoscope was introduced through the anus and  advanced to the the cecum, identified by the                         appendiceal orifice. The colonoscopy was performed                         with ease. The patient tolerated the procedure well.                         The quality of the bowel  preparation was good. Findings:      The perianal and digital rectal examinations were normal.      Multiple small and large-mouthed diverticula were found in the right       colon.      A 5 mm polyp was found in the rectum. The polyp was sessile. The polyp       was removed with a cold snare. Resection and retrieval were complete.      The exam was otherwise without abnormality on direct and retroflexion       views. Impression:            - Diverticulosis in the right colon.                        - One 5 mm polyp in the rectum, removed with a cold                         snare. Resected and retrieved.                        - The examination was otherwise normal on direct and                         retroflexion views. Recommendation:        - Discharge patient to home (with escort).                        - Resume previous diet.                        - Continue present medications.                        - Await pathology results.                        - Repeat colonoscopy for surveillance based on                         pathology results. Procedure Code(s):     --- Professional ---                        [phone], Colonoscopy, flexible; with removal of                         tumor(s), polyp(s), or other lesion(s) by snare                         technique Diagnosis Code(s):     --- Professional ---  K62.1, Rectal polyp                        K57.32, Diverticulitis of large intestine without                         perforation or abscess without bleeding                        K57.30, Diverticulosis of large intestine without                         perforation or abscess without bleeding CPT copyright 2019 American Medical Association. All rights reserved. The codes documented in this report are preliminary and upon coder review may  be revised to meet current compliance requirements. [name], MD [name] MD, MD 05/15/2020 2:39:22 PM This report has  been signed electronically. Number of Addenda: 0 Note Initiated On: 05/15/2020 2:18 PM Scope Withdrawal Time: 0 hours 7 minutes 11 seconds  Total Procedure Duration: 0 hours 9 minutes 38 seconds  Estimated Blood Loss:  Estimated blood loss: none.      [facility]

## 2020-05-15 NOTE — H&P (Signed)
[name], MD [address], [address], [place], [place], [address] [address], [place], [place], [address] Phone: [phone]  Fax: [phone]  Primary Care Physician:  Georgann Housekeeper, MD   Pre-Procedure History & Physical: HPI:  Jacob Huerta is a 29 y.o. male is here for an colonoscopy.   Past Medical History:  Diagnosis Date  . Acute appendicitis   . Asthma   . 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 IR RADIOLOGIST EVAL & MGMT  01/17/2020  . IR RADIOLOGIST EVAL & MGMT  01/31/2020  . LAPAROSCOPIC APPENDECTOMY N/A 09/27/2019   Procedure: APPENDECTOMY LAPAROSCOPIC;  Surgeon: Sung Amabile, DO;  Location: [facility] ORS;  Service: General;  Laterality: N/A;  . OTHER SURGICAL HISTORY     jaw surgery, rotator cuff surgery    Prior to Admission medications   Medication Sig Start Date End Date Taking? Authorizing Provider  albuterol (PROVENTIL HFA;VENTOLIN HFA) 108 (90 BASE) MCG/ACT inhaler Inhale 2 puffs into the lungs every 6 (six) hours as needed. For breathing   Yes [provider]  Na Sulfate-K Sulfate-Mg Sulf 17.5-3.13-1.6 GM/177ML SOLN At 5 PM the day before procedure take 1 bottle and 5 hours before procedure take 1 bottle. 04/17/20  Yes [name], MD  omeprazole (PRILOSEC) 40 MG capsule Take 40 mg by mouth in the morning and at bedtime.   Yes [provider]    Allergies as of 04/17/2020 - Review Complete 04/17/2020  Allergen Reaction Noted  . Uncaria tomentosa (cats claw) Itching 10/05/2019  . Hydrocodone  03/08/2012    History reviewed. No pertinent family history.  Social History   Socioeconomic History  . Marital status: Single    Spouse name: Not on file  . Number of children: Not on file  . Years of education: Not on file  . Highest education level: Not on file  Occupational History  . Not on file  Tobacco Use  . Smoking status:  Current Every Day Smoker    Packs/day: 0.25    Types: Cigarettes  . Smokeless tobacco: Never Used  Substance and Sexual Activity  . Alcohol use: Not Currently    Comment: occ  . Drug use: No  . Sexual activity: Not on file  Other Topics Concern  . Not on file  Social History Narrative  . Not on file   Social Determinants of Health   Financial Resource Strain:   . Difficulty of Paying Living Expenses:   Food Insecurity:   . Worried About Programme researcher, broadcasting/film/video in the Last Year:   . Barista in the Last Year:   Transportation Needs:   . Freight forwarder (Medical):   Marland Kitchen Lack of Transportation (Non-Medical):   Physical Activity:   . Days of Exercise per Week:   . Minutes of Exercise per Session:   Stress:   . Feeling of Stress :   Social Connections:   . Frequency of Communication with Friends and Family:   . Frequency of Social Gatherings with Friends and Family:   . Attends Religious Services:   . Active Member of Clubs or Organizations:   . Attends Banker Meetings:   Marland Kitchen Marital Status:   Intimate Partner Violence:   . Fear of Current or Ex-Partner:   . Emotionally Abused:   Marland Kitchen Physically Abused:   . Sexually Abused:  Review of Systems: See HPI, otherwise negative ROS  Physical Exam: BP (!) 154/98   Pulse 84   Temp (!) 97.4 F (36.3 C) (Temporal)   Resp 20   Ht 5' 8.5" (1.74 m)   Wt 108.9 kg   SpO2 99%   BMI 35.96 kg/m  General:   Alert,  pleasant and cooperative in NAD Head:  Normocephalic and atraumatic. Neck:  Supple; no masses or thyromegaly. Lungs:  Clear throughout to auscultation, normal respiratory effort.    Heart:  +S1, +S2, Regular rate and rhythm, No edema. Abdomen:  Soft, nontender and nondistended. Normal bowel sounds, without guarding, and without rebound.   Neurologic:  Alert and  oriented x4;  grossly normal neurologically.  Impression/Plan: Jacob Huerta is here for an colonoscopy to be performed for  diverticulitis.Risks, benefits, limitations, and alternatives regarding  colonoscopy have been reviewed with the patient.  Questions have been answered.  All parties agreeable.   [name], MD  05/15/2020, 2:19 PM

## 2020-05-15 NOTE — Anesthesia Preprocedure Evaluation (Signed)
Anesthesia Evaluation  Patient identified by MRN, date of birth, ID band Patient awake    Reviewed: Allergy & Precautions, H&P , NPO status , Patient's Chart, lab work & pertinent test results  History of Anesthesia Complications Negative for: history of anesthetic complications  Airway Mallampati: III  TM Distance: >3 FB Neck ROM: full    Dental  (+) Chipped, Poor Dentition, Missing   Pulmonary neg shortness of breath, asthma , Current Smoker and Patient abstained from smoking.,    Pulmonary exam normal        Cardiovascular Exercise Tolerance: Good (-) angina(-) Past MI and (-) DOE negative cardio ROS Normal cardiovascular exam     Neuro/Psych negative neurological ROS  negative psych ROS   GI/Hepatic negative GI ROS, Neg liver ROS, neg GERD  ,  Endo/Other  negative endocrine ROS  Renal/GU negative Renal ROS  negative genitourinary   Musculoskeletal   Abdominal   Peds  Hematology negative hematology ROS (+)   Anesthesia Other Findings Past Medical History: No date: Acute appendicitis No date: Asthma 01/04/2020: Cecal diverticulitis     Comment:  with abscess, s/p drainage 01/08/20  Past Surgical History: 01/17/2020: IR RADIOLOGIST EVAL & MGMT 01/31/2020: IR RADIOLOGIST EVAL & MGMT 09/27/2019: LAPAROSCOPIC APPENDECTOMY; N/A     Comment:  Procedure: APPENDECTOMY LAPAROSCOPIC;  Surgeon: Sung Amabile, DO;  Location: [facility] ORS;  Service: General;                Laterality: N/A; No date: OTHER SURGICAL HISTORY     Comment:  jaw surgery, rotator cuff surgery  BMI    Body Mass Index: 35.96 kg/m      Reproductive/Obstetrics negative OB ROS                             Anesthesia Physical Anesthesia Plan  ASA: III  Anesthesia Plan: General   Post-op Pain Management:    Induction: Intravenous  PONV Risk Score and Plan: Propofol infusion and TIVA  Airway Management  Planned: Natural Airway and Nasal Cannula  Additional Equipment:   Intra-op Plan:   Post-operative Plan:   Informed Consent: I have reviewed the patients History and Physical, chart, labs and discussed the procedure including the risks, benefits and alternatives for the proposed anesthesia with the patient or authorized representative who has indicated his/her understanding and acceptance.     Dental Advisory Given  Plan Discussed with: Anesthesiologist, CRNA and Surgeon  Anesthesia Plan Comments: (Patient consented for risks of anesthesia including but not limited to:  - adverse reactions to medications - risk of intubation if required - damage to eyes, teeth, lips or other oral mucosa - nerve damage due to positioning  - sore throat or hoarseness - Damage to heart, brain, nerves, lungs, other parts of body or loss of life  Patient voiced understanding.)        Anesthesia Quick Evaluation

## 2020-05-16 ENCOUNTER — Encounter: Payer: Self-pay | Admitting: Gastroenterology

## 2020-05-16 NOTE — Anesthesia Postprocedure Evaluation (Signed)
Anesthesia Post Note  Patient: Jacob Huerta  Procedure(s) Performed: COLONOSCOPY WITH PROPOFOL (N/A )  Patient location during evaluation: Endoscopy Anesthesia Type: General Level of consciousness: awake and alert Pain management: pain level controlled Vital Signs Assessment: post-procedure vital signs reviewed and stable Respiratory status: spontaneous breathing, nonlabored ventilation, respiratory function stable and patient connected to nasal cannula oxygen Cardiovascular status: blood pressure returned to baseline and stable Postop Assessment: no apparent nausea or vomiting Anesthetic complications: no   No complications documented.   Last Vitals:  Vitals:   05/15/20 1452 05/15/20 1502  BP: 138/88 140/88  Pulse:    Resp: 16   Temp:    SpO2:      Last Pain:  Vitals:   05/15/20 1502  TempSrc:   PainSc: 0-No pain                 Jacob Huerta

## 2020-05-17 LAB — SURGICAL PATHOLOGY

## 2020-05-21 ENCOUNTER — Encounter: Payer: Self-pay | Admitting: Gastroenterology

## 2020-05-29 ENCOUNTER — Encounter: Payer: Self-pay | Admitting: Surgery

## 2020-05-29 ENCOUNTER — Ambulatory Visit (INDEPENDENT_AMBULATORY_CARE_PROVIDER_SITE_OTHER): Payer: Self-pay | Admitting: Surgery

## 2020-05-29 ENCOUNTER — Other Ambulatory Visit: Payer: Self-pay

## 2020-05-29 VITALS — BP 146/93 | HR 89 | Temp 98.5°F | Resp 18 | Ht 68.5 in | Wt 258.2 lb

## 2020-05-29 DIAGNOSIS — R1084 Generalized abdominal pain: Secondary | ICD-10-CM

## 2020-05-29 DIAGNOSIS — K5720 Diverticulitis of large intestine with perforation and abscess without bleeding: Secondary | ICD-10-CM

## 2020-05-29 NOTE — Progress Notes (Signed)
05/29/2020  History of Present Illness: Jacob Huerta is a 29 y.o. male with a history of perforated cecal diverticulitis with abscess, resulting in temporary fistula which healed.  He was admitted on 4/8, had the drain placed on 4/12, and drain removed after fistula healed on 5/5.  He was last seen on 5/26 at which time he reported persistent soreness though improved.  CT scan was obtained on 6/4 which showed no diverticulitis or abscess, but that the right colon still had some thickening.  He had a colonoscopy with Dr. Tobi Bastos on 8/18 which showed right sided diverticular disease and a benign rectal polyp.  Patient today reports that even since the drain got removed, he's still having issues with persistent pain in the right side of his abdomen.  He feels bloated and with intermittent nausea particularly during episodes of pain. The pain has a baseline degree of soreness that is constant, with spikes of worse pain.  Denies any fevers, chills.  Sometimes he gets diarrhea, sometimes it's normal stool.  He does take Metamucil as instructed previously, and avoiding greasy foods.  Past Medical History: Past Medical History:  Diagnosis Date  . Acute appendicitis   . Asthma   . 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 COLONOSCOPY WITH PROPOFOL N/A 05/15/2020   Procedure: COLONOSCOPY WITH PROPOFOL;  Surgeon: [name], MD;  Location: [facility] ENDOSCOPY;  Service: Gastroenterology;  Laterality: N/A;  . IR RADIOLOGIST EVAL & MGMT  01/17/2020  . IR RADIOLOGIST EVAL & MGMT  01/31/2020  . LAPAROSCOPIC APPENDECTOMY N/A 09/27/2019   Procedure: APPENDECTOMY LAPAROSCOPIC;  Surgeon: Sung Amabile, DO;  Location: [facility] ORS;  Service: General;  Laterality: N/A;  . OTHER SURGICAL HISTORY     jaw surgery, rotator cuff surgery    Home Medications: Prior to Admission medications   Medication Sig Start Date  End Date Taking? Authorizing Provider  albuterol (PROVENTIL HFA;VENTOLIN HFA) 108 (90 BASE) MCG/ACT inhaler Inhale 2 puffs into the lungs every 6 (six) hours as needed. For breathing   Yes [provider]  omeprazole (PRILOSEC) 40 MG capsule Take 40 mg by mouth in the morning and at bedtime.   Yes [provider]  psyllium (METAMUCIL) 58.6 % powder Take 1 packet by mouth 3 (three) times daily.   Yes [provider]    Allergies: Allergies  Allergen Reactions  . Uncaria Tomentosa (Cats Claw) Itching  . Hydrocodone     Vomiting, itching    Review of Systems: Review of Systems  Constitutional: Negative for chills and fever.  Respiratory: Negative for shortness of breath.   Cardiovascular: Negative for chest pain.  Gastrointestinal: Positive for abdominal pain, diarrhea and nausea. Negative for blood in stool, constipation and vomiting.  Genitourinary: Negative for dysuria.  Musculoskeletal: Negative for myalgias.    Physical Exam BP (!) 146/93   Pulse 89   Temp 98.5 F (36.9 C) (Oral)   Resp 18   Ht 5' 8.5" (1.74 m)   Wt 258 lb 3.2 oz (117.1 kg)   SpO2 96%   BMI 38.69 kg/m  CONSTITUTIONAL: No acute distress HEENT:  Normocephalic, atraumatic, extraocular motion intact. RESPIRATORY:  Lungs are clear, and breath sounds are equal bilaterally. Normal respiratory effort without pathologic use of accessory muscles. CARDIOVASCULAR: Heart is regular without murmurs, gallops, or rubs. GI: The abdomen is soft, non-distended, with tenderness to palpation along the right abdomen.  The worse point seems to be in the mid portion of the right abdomen, but there is tenderness in right lower quadrant near the point where the drain used to be, right upper quadrant, and some in epigastric area.   His laparoscopic incisions are well healed, but he does have a small umbilical hernia. NEUROLOGIC:  Motor and sensation is grossly normal.  Cranial nerves are grossly  intact. PSYCH:  Alert and oriented to person, place and time. Affect is normal.  Labs/Imaging: None recently  Assessment and Plan: This is a 29 y.o. male with history of diverticulitis with abscess, s/p percutaneous drainage, complicated by fistula formation which healed.  --Discussed with the patient that given his symptoms and persistence since his drain was removed, that we would re-evaluate with a CT scan of abdomen/pelvis.  This will allow [place] to see if there is any further inflammation or abscess formation.  If there is some inflammation, would consider a repeat course of antibiotics. --If the CT scan is overall negative, I discussed with the patient that another possibility for his symptoms could be his gallbladder.  We could order HIDA scan if the CT scan is negative. --Discussed with him as well that currently [facility] has placed a stop on all elective surgeries that require admission.  If there's still inflammation or persistent pain, may have to discuss with leadership about the possibility of doing his surgery sooner than October.   --I will call him with the results of the CT and have him follow up in a week to discuss further plans.  Face-to-face time spent with the patient and care providers was 25 minutes, with more than 50% of the time spent counseling, educating, and coordinating care of the patient.     [name], MD [facility]

## 2020-05-29 NOTE — Patient Instructions (Signed)
We will scheduled you for a CT scan and follow up with [place] next week.

## 2020-05-30 ENCOUNTER — Other Ambulatory Visit: Payer: Self-pay

## 2020-05-30 ENCOUNTER — Telehealth: Payer: Self-pay

## 2020-05-30 DIAGNOSIS — R1084 Generalized abdominal pain: Secondary | ICD-10-CM

## 2020-05-30 DIAGNOSIS — K5720 Diverticulitis of large intestine with perforation and abscess without bleeding: Secondary | ICD-10-CM

## 2020-05-30 NOTE — Addendum Note (Signed)
Addended by: Sinda Du on: 05/30/2020 09:04 AM   Modules accepted: Orders

## 2020-05-30 NOTE — Telephone Encounter (Signed)
The patient is scheduled for a CT abdomen and pelvis with contrast at the [facility] on 05/31/20 at 11:00 am. He is to arrive there by 10:45 am and have nothing to eat or drink for 4 hours prior. He is aware to pick up his prep kit today. He will follow up with Dr Piscoya next week.  °

## 2020-05-31 ENCOUNTER — Other Ambulatory Visit: Payer: Self-pay

## 2020-05-31 ENCOUNTER — Ambulatory Visit
Admission: RE | Admit: 2020-05-31 | Discharge: 2020-05-31 | Disposition: A | Discharge status: Home / Self Care | Payer: Self-pay | Source: Ambulatory Visit | Attending: Surgery | Admitting: Surgery

## 2020-05-31 DIAGNOSIS — R1084 Generalized abdominal pain: Secondary | ICD-10-CM | POA: Insufficient documentation

## 2020-05-31 MED ORDER — IOHEXOL 300 MG/ML  SOLN
125.0000 mL | Freq: Once | INTRAMUSCULAR | Status: AC | PRN
  Administered 2020-05-31: 125 mL via INTRAVENOUS

## 2020-06-04 ENCOUNTER — Telehealth: Payer: Self-pay | Admitting: Emergency Medicine

## 2020-06-04 ENCOUNTER — Other Ambulatory Visit: Payer: Self-pay | Admitting: Emergency Medicine

## 2020-06-04 DIAGNOSIS — R1084 Generalized abdominal pain: Secondary | ICD-10-CM

## 2020-06-04 NOTE — Telephone Encounter (Signed)
Per Dr Aleen Campi, he spoke to patient and Hida Scan to be ordered.   Hida Scan appt: 06/12/20 at 8am at the [facility], Arrival time 7:30am Nothing to eat/drink after midnight.   F/u appt with Dr Aleen Campi cancelled from 06/05/20 and rescheduled to 06/14/20 at 9:15am.   Called patient with NO answer. Left vm to call the office back to give above details.

## 2020-06-04 NOTE — Telephone Encounter (Signed)
Patient called back and is aware of details. He is to call the office back for any questions or concerns.

## 2020-06-05 ENCOUNTER — Ambulatory Visit: Payer: Self-pay | Admitting: Surgery

## 2020-06-10 ENCOUNTER — Ambulatory Visit: Payer: Self-pay | Admitting: Surgery

## 2020-06-12 ENCOUNTER — Ambulatory Visit: Admission: RE | Admit: 2020-06-12 | Payer: Self-pay | Source: Ambulatory Visit

## 2020-06-12 ENCOUNTER — Telehealth: Payer: Self-pay | Admitting: Emergency Medicine

## 2020-06-12 NOTE — Telephone Encounter (Signed)
Pt missed Hida Scan that was scheduled for today.  New appt for Hida Scan [facility] Arrival 815am. NPO after midnight.   F/u appt with Dr Aleen Campi 07/08/20 at 2pm.  Called patient with new appts. Pt voiced understanding.

## 2020-06-14 ENCOUNTER — Ambulatory Visit: Payer: Self-pay | Admitting: Surgery

## 2020-06-26 ENCOUNTER — Other Ambulatory Visit: Payer: Self-pay

## 2020-07-02 ENCOUNTER — Other Ambulatory Visit: Payer: Self-pay

## 2020-07-02 ENCOUNTER — Encounter
Admission: RE | Admit: 2020-07-02 | Discharge: 2020-07-02 | Disposition: A | Discharge status: Home / Self Care | Payer: Self-pay | Source: Ambulatory Visit | Attending: Surgery | Admitting: Surgery

## 2020-07-02 DIAGNOSIS — R1084 Generalized abdominal pain: Secondary | ICD-10-CM | POA: Insufficient documentation

## 2020-07-02 MED ORDER — TECHNETIUM TC 99M MEBROFENIN IV KIT
5.0220 | PACK | Freq: Once | INTRAVENOUS | Status: AC | PRN
  Administered 2020-07-02: 5.022 via INTRAVENOUS

## 2020-07-03 ENCOUNTER — Telehealth: Payer: Self-pay

## 2020-07-03 NOTE — Telephone Encounter (Signed)
Patient notified HIDA scan normal per Dr.Pabon- patient reminded to follow up 07/08/20 @ 2 pm.

## 2020-07-08 ENCOUNTER — Ambulatory Visit (INDEPENDENT_AMBULATORY_CARE_PROVIDER_SITE_OTHER): Payer: Self-pay | Admitting: Surgery

## 2020-07-08 ENCOUNTER — Other Ambulatory Visit: Payer: Self-pay

## 2020-07-08 ENCOUNTER — Encounter: Payer: Self-pay | Admitting: Surgery

## 2020-07-08 VITALS — BP 139/81 | HR 94 | Temp 98.0°F | Resp 12 | Ht 68.5 in | Wt 252.4 lb

## 2020-07-08 DIAGNOSIS — K5720 Diverticulitis of large intestine with perforation and abscess without bleeding: Secondary | ICD-10-CM

## 2020-07-08 MED ORDER — METRONIDAZOLE 500 MG PO TABS: ORAL_TABLET | ORAL | 0 refills | Status: DC

## 2020-07-08 MED ORDER — POLYETHYLENE GLYCOL 3350 17 GM/SCOOP PO POWD: ORAL | 0 refills | Status: DC

## 2020-07-08 MED ORDER — BISACODYL 5 MG PO TBEC: DELAYED_RELEASE_TABLET | ORAL | 0 refills | Status: DC

## 2020-07-08 MED ORDER — NEOMYCIN SULFATE 500 MG PO TABS: ORAL_TABLET | ORAL | 0 refills | Status: DC

## 2020-07-08 NOTE — H&P (View-Only) (Signed)
07/08/2020  History of Present Illness: Jacob Huerta is a 29 y.o. male with a history of cecal diverticulitis with abscess on 01/04/20, s/p percutaneous drainage on 4/12.  Drain was eventually removed on 5/5.  He has had a couple of follow up CT scans due to abdominal discomfort which have been negative for any new abscess or inflammation.  He was also having issues with nausea and dry heaving on occasion, with right sided abdominal discomfort.  A most recent CT scan on 9/3 was negative and a HIDA scan on 10/5 was also negative, with normal EF.  He presents today for follow up and to discuss surgery.  Patient reports that his right sided soreness is still present, and is having still the nausea and bloatedness on ocassion.  He reports having issues with IBS vs possible gluten issues.  Past Medical History: Past Medical History:  Diagnosis Date  . Acute appendicitis   . Asthma   . 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 COLONOSCOPY WITH PROPOFOL N/A 05/15/2020   Procedure: COLONOSCOPY WITH PROPOFOL;  Surgeon: Anna, Kiran, MD;  Location: [facility] ENDOSCOPY;  Service: Gastroenterology;  Laterality: N/A;  . IR RADIOLOGIST EVAL & MGMT  01/17/2020  . IR RADIOLOGIST EVAL & MGMT  01/31/2020  . LAPAROSCOPIC APPENDECTOMY N/A 09/27/2019   Procedure: APPENDECTOMY LAPAROSCOPIC;  Surgeon: Sakai, Isami, DO;  Location: [facility] ORS;  Service: General;  Laterality: N/A;  . OTHER SURGICAL HISTORY     jaw surgery, rotator cuff surgery    Home Medications: Prior to Admission medications   Medication Sig Start Date End Date Taking? Authorizing Provider  albuterol (PROVENTIL HFA;VENTOLIN HFA) 108 (90 BASE) MCG/ACT inhaler Inhale 2 puffs into the lungs every 6 (six) hours as needed. For breathing   Yes [provider]  omeprazole (PRILOSEC) 40 MG capsule Take 40 mg by mouth in the morning and at  bedtime.   Yes [provider]  psyllium (METAMUCIL) 58.6 % powder Take 1 packet by mouth 3 (three) times daily.   Yes [provider]  bisacodyl (DULCOLAX) 5 MG EC tablet Take all 4 tablets at 8 am the morning prior to your surgery. 07/08/20   Christyne Mccain, MD  metroNIDAZOLE (FLAGYL) 500 MG tablet Take 2 tabs at 8 am, take 2 tabs at 2 pm, and take 2 tabs at 8pm the day prior to surgery. 07/08/20   Tyqwan Pink, MD  neomycin (MYCIFRADIN) 500 MG tablet Take 2 tablet at 8am, take 2 tablets at 2pm, and take 2 tablets at 8pm the day prior to your surgery 07/08/20   Alandis Bluemel, MD  polyethylene glycol powder (MIRALAX) 17 GM/SCOOP powder Mix full container in 64 ounces of Gatorade or other clear liquid. 07/08/20   Moosa Bueche, MD    Allergies: Allergies  Allergen Reactions  . Uncaria Tomentosa (Cats Claw) Itching  . Hydrocodone     Vomiting, itching    Review of Systems: Review of Systems  Constitutional: Negative for chills and fever.  Respiratory: Negative for shortness of breath.   Cardiovascular: Negative for chest pain.  Gastrointestinal: Positive for abdominal pain, diarrhea and nausea. Negative for blood in stool, constipation and vomiting.    Physical Exam BP 139/81   Pulse 94   Temp 98 F (36.7 C)   Resp 12   Ht 5' 8.5" (1.74 m)   Wt 252 lb 6.4 oz (114.5 kg)     SpO2 96%   BMI 37.82 kg/m  CONSTITUTIONAL: No acute distress HEENT:  Normocephalic, atraumatic, extraocular motion intact. RESPIRATORY:  Lungs are clear, and breath sounds are equal bilaterally. Normal respiratory effort without pathologic use of accessory muscles. CARDIOVASCULAR: Heart is regular without murmurs, gallops, or rubs. GI: The abdomen is soft, non-distended, with some soreness to palpation in the right abdomen.  No peritonitis or worsening pain compared to prior exams.  Prior laparoscopic appendectomy incisions are well healed. NEUROLOGIC:  Motor and sensation is grossly normal.   Cranial nerves are grossly intact. PSYCH:  Alert and oriented to person, place and time. Affect is normal.  Labs/Imaging: CT scan abdomen/pelvis 05/31/20: IMPRESSION: Hepatic steatosis. No other abnormality seen in the abdomen or pelvis.  HIDA scan 07/02/20: IMPRESSION: Normal hepatobiliary scan, demonstrating patency of cystic and common bile ducts.  Normal gallbladder ejection fraction.  Assessment and Plan: This is a 29 y.o. male with hx of cecal diverticulitis with abscess, s/p percutaneous drainage.  --Discussed with the patient the CT scan and HIDA scan results, both being negative for any worsening diverticulitis or gallbladder pathology.  I think the soreness is related to possible scarring from the diverticulitis.  He already had issues with diarrhea and bloatedness prior to his diverticulitis which may just be chronic, possible IBS.  Unclear why he has nausea, but his HIDA scan was negative for dyskinesia.   --Discussed with the patient that now the hospital will start opening for elective surgery that requires hospital admission starting on 10/25.  Discussed with him my plan for a laparoscopic right colectomy.  Discussed with him the risks of bleeding, infection, injury to surrounding structures.  Discussed that we would ask Urology to do a cystoscopy with ICG injection into the ureters for better identification intraoperatively.  Discussed hospital stay, post-op recovery, possible need for drains, the low possibility for ostomy.  He's willing to proceed. --Patient will discuss timing of surgery with his family and work and will call us back to set a date for surgery.  Face-to-face time spent with the patient and care providers was 25 minutes, with more than 50% of the time spent counseling, educating, and coordinating care of the patient.     [name], MD [facility]

## 2020-07-08 NOTE — Progress Notes (Signed)
07/08/2020  History of Present Illness: Jacob Huerta is a 29 y.o. male with a history of cecal diverticulitis with abscess on 01/04/20, s/p percutaneous drainage on 4/12.  Drain was eventually removed on 5/5.  He has had a couple of follow up CT scans due to abdominal discomfort which have been negative for any new abscess or inflammation.  He was also having issues with nausea and dry heaving on occasion, with right sided abdominal discomfort.  A most recent CT scan on 9/3 was negative and a HIDA scan on 10/5 was also negative, with normal EF.  He presents today for follow up and to discuss surgery.  Patient reports that his right sided soreness is still present, and is having still the nausea and bloatedness on ocassion.  He reports having issues with IBS vs possible gluten issues.  Past Medical History: Past Medical History:  Diagnosis Date  . Acute appendicitis   . Asthma   . 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 COLONOSCOPY WITH PROPOFOL N/A 05/15/2020   Procedure: COLONOSCOPY WITH PROPOFOL;  Surgeon: [name], MD;  Location: [facility] ENDOSCOPY;  Service: Gastroenterology;  Laterality: N/A;  . IR RADIOLOGIST EVAL & MGMT  01/17/2020  . IR RADIOLOGIST EVAL & MGMT  01/31/2020  . LAPAROSCOPIC APPENDECTOMY N/A 09/27/2019   Procedure: APPENDECTOMY LAPAROSCOPIC;  Surgeon: Sung Amabile, DO;  Location: [facility] ORS;  Service: General;  Laterality: N/A;  . OTHER SURGICAL HISTORY     jaw surgery, rotator cuff surgery    Home Medications: Prior to Admission medications   Medication Sig Start Date End Date Taking? Authorizing Provider  albuterol (PROVENTIL HFA;VENTOLIN HFA) 108 (90 BASE) MCG/ACT inhaler Inhale 2 puffs into the lungs every 6 (six) hours as needed. For breathing   Yes [provider]  omeprazole (PRILOSEC) 40 MG capsule Take 40 mg by mouth in the morning and at  bedtime.   Yes [provider]  psyllium (METAMUCIL) 58.6 % powder Take 1 packet by mouth 3 (three) times daily.   Yes [provider]  bisacodyl (DULCOLAX) 5 MG EC tablet Take all 4 tablets at 8 am the morning prior to your surgery. 07/08/20   Henrene Dodge, MD  metroNIDAZOLE (FLAGYL) 500 MG tablet Take 2 tabs at 8 am, take 2 tabs at 2 pm, and take 2 tabs at 8pm the day prior to surgery. 07/08/20   Henrene Dodge, MD  neomycin (MYCIFRADIN) 500 MG tablet Take 2 tablet at 8am, take 2 tablets at 2pm, and take 2 tablets at 8pm the day prior to your surgery 07/08/20   Henrene Dodge, MD  polyethylene glycol powder (MIRALAX) 17 GM/SCOOP powder Mix full container in 64 ounces of Gatorade or other clear liquid. 07/08/20   Henrene Dodge, MD    Allergies: Allergies  Allergen Reactions  . Uncaria Tomentosa (Cats Claw) Itching  . Hydrocodone     Vomiting, itching    Review of Systems: Review of Systems  Constitutional: Negative for chills and fever.  Respiratory: Negative for shortness of breath.   Cardiovascular: Negative for chest pain.  Gastrointestinal: Positive for abdominal pain, diarrhea and nausea. Negative for blood in stool, constipation and vomiting.    Physical Exam BP 139/81   Pulse 94   Temp 98 F (36.7 C)   Resp 12   Ht 5' 8.5" (1.74 m)   Wt 252 lb 6.4 oz (114.5 kg)  SpO2 96%   BMI 37.82 kg/m  CONSTITUTIONAL: No acute distress HEENT:  Normocephalic, atraumatic, extraocular motion intact. RESPIRATORY:  Lungs are clear, and breath sounds are equal bilaterally. Normal respiratory effort without pathologic use of accessory muscles. CARDIOVASCULAR: Heart is regular without murmurs, gallops, or rubs. GI: The abdomen is soft, non-distended, with some soreness to palpation in the right abdomen.  No peritonitis or worsening pain compared to prior exams.  Prior laparoscopic appendectomy incisions are well healed. NEUROLOGIC:  Motor and sensation is grossly normal.   Cranial nerves are grossly intact. PSYCH:  Alert and oriented to person, place and time. Affect is normal.  Labs/Imaging: CT scan abdomen/pelvis 05/31/20: IMPRESSION: Hepatic steatosis. No other abnormality seen in the abdomen or pelvis.  HIDA scan 07/02/20: IMPRESSION: Normal hepatobiliary scan, demonstrating patency of cystic and common bile ducts.  Normal gallbladder ejection fraction.  Assessment and Plan: This is a 29 y.o. male with hx of cecal diverticulitis with abscess, s/p percutaneous drainage.  --Discussed with the patient the CT scan and HIDA scan results, both being negative for any worsening diverticulitis or gallbladder pathology.  I think the soreness is related to possible scarring from the diverticulitis.  He already had issues with diarrhea and bloatedness prior to his diverticulitis which may just be chronic, possible IBS.  Unclear why he has nausea, but his HIDA scan was negative for dyskinesia.   --Discussed with the patient that now the hospital will start opening for elective surgery that requires hospital admission starting on 10/25.  Discussed with him my plan for a laparoscopic right colectomy.  Discussed with him the risks of bleeding, infection, injury to surrounding structures.  Discussed that we would ask Urology to do a cystoscopy with ICG injection into the ureters for better identification intraoperatively.  Discussed hospital stay, post-op recovery, possible need for drains, the low possibility for ostomy.  He's willing to proceed. --Patient will discuss timing of surgery with his family and work and will call us back to set a date for surgery.  Face-to-face time spent with the patient and care providers was 25 minutes, with more than 50% of the time spent counseling, educating, and coordinating care of the patient.     [name], MD [facility]

## 2020-07-08 NOTE — Patient Instructions (Addendum)
Our surgery scheduler will contact you to schedule your surgery. Please have the blue sheet available when she calls you.   Please refer back to the white sheet called "colon surgery prep" to complete prior to surgery. This is a Bowel Prep and will need to be complete. Medications have been sent to your pharmacy, so pick these up.   Call the office if you have any questions or concerns.   Laparoscopic Colectomy Laparoscopic colectomy is surgery to remove part or all of the large intestine (colon). This procedure may be used to treat several conditions, including:  Inflammation and infection of the colon (diverticulitis).  Tumors or masses in the colon.  Inflammatory bowel disease, such as Crohn disease or ulcerative colitis. Colectomy is an option when symptoms cannot be controlled with medicines.  Bleeding from the colon that cannot be controlled by another method.  Blockage or obstruction of the colon. Tell a health care provider about:  Any allergies you have.  All medicines you are taking, including vitamins, herbs, eye drops, creams, and over-the-counter medicines.  Any problems you or family members have had with anesthetic medicines.  Any blood disorders you have.  Any surgeries you have had.  Any medical conditions you have. What are the risks? Generally, this is a safe procedure. However, problems may occur, including:  Infection.  Bleeding.  Allergic reactions to medicines or dyes.  Damage to other structures or organs.  Leaking from where the colon was sewn together.  Future blockage of the small intestines from scar tissue. Another surgery may be needed to repair this.  Needing to convert to an open procedure. Complications such as damage to other organs or excessive bleeding may require the surgeon to convert from a laparoscopic procedure to an open procedure. This involves making a larger incision in the abdomen. What happens before the procedure? Staying  hydrated Follow instructions from your health care provider about hydration, which may include:  Up to 2 hours before the procedure - you may continue to drink clear liquids, such as water, clear fruit juice, black coffee, and plain tea. Eating and drinking restrictions Follow instructions from your health care provider about eating and drinking, which may include:  8 hours before the procedure - stop eating heavy meals, meals with high fiber, or foods such as meat, fried foods, or fatty foods.  6 hours before the procedure - stop eating light meals or foods, such as toast or cereal.  6 hours before the procedure - stop drinking milk or drinks that contain milk.  2 hours before the procedure - stop drinking clear liquids. Medicines  Ask your health care provider about: ? Changing or stopping your regular medicines. This is especially important if you are taking diabetes medicines or blood thinners. ? Taking medicines such as aspirin and ibuprofen. These medicines can thin your blood. Do not take these medicines before your procedure if your health care provider instructs you not to.  You may be given antibiotic medicine to clean out bacteria from your colon. Follow the directions carefully and take the medicine at the correct time. General instructions  You may be prescribed an oral bowel prep to clean out your colon in preparation for the surgery: ? Follow instructions from your health care provider about how to do this. ? Do not eat or drink anything else after you have started the bowel prep, unless your health care provider tells you it is safe to do so.  Do not use any products  that contain nicotine or tobacco, such as cigarettes and e-cigarettes. If you need help quitting, ask your health care provider. What happens during the procedure?  To reduce your risk of infection: ? Your health care team will wash or sanitize their hands. ? Your skin will be washed with soap.  An IV  tube will be inserted into one of your veins to deliver fluid and medication.  You will be given one of the following: ? A medicine to help you relax (sedative). ? A medicine to make you fall asleep (general anesthetic).  Small monitors will be connected to your body. They will be used to check your heart, blood pressure, and oxygen level.  A breathing tube may be placed into your lungs during the procedure.  A thin, flexible tube (catheter) will be placed into your bladder to drain urine.  A tube may be placed through your nose and into your stomach to drain stomach fluids (nasogastric tube, or NG tube).  Your abdomen will be filled with air so it expands. This gives the surgeon more room to operate and makes your organs easier to see.  Several small cuts (incisions) will be made in your abdomen.  A thin, lighted tube with a tiny camera on the end (laparoscope) will be put through one of the small incisions. The camera on the laparoscope will send a picture to a computer screen in the operating room. This will give the surgeon a good view inside your abdomen.  Hollow tubes will be put through the other small incisions in your abdomen. The tools that are needed for the procedure will be put through these tubes.  Clamps or staples will be put on both ends of the diseased part of the colon.  The part of the intestine between the clamps or staples will be removed.  If possible, the ends of the healthy colon that remain will be stitched (sutured) or stapled together to allow your body to pass waste (stool).  Sometimes, the remaining colon cannot be stitched back together. If this is the case, a colostomy will be needed. If you need a colostomy: ? An opening to the outside of your body (stoma) will be made through your abdomen. ? The end of your colon will be brought to the opening. It will be stitched to the skin. ? A bag will be attached to the opening. Stool will drain into this  removable bag. ? The colostomy may be temporary or permanent.  The incisions from the colectomy will be closed with sutures or staples. The procedure may vary among health care providers and hospitals. What happens after the procedure?  Your blood pressure, heart rate, breathing rate, and blood oxygen level will be monitored until the medicines you were given have worn off.  You will receive fluids through an IV tube until your bowels start to work properly.  Once your bowels are working again, you will be given clear liquids first and then solid food as tolerated.  You will be given medicines to control your pain and nausea, if needed.  Do not drive for 24 hours if you were given a sedative. This information is not intended to replace advice given to you by your health care provider. Make sure you discuss any questions you have with your health care provider. Document Revised: 08/27/2017 Document Reviewed: 06/15/2016 Elsevier Patient Education  2020 [employer].

## 2020-07-18 ENCOUNTER — Other Ambulatory Visit: Payer: Self-pay | Admitting: Radiology

## 2020-07-19 ENCOUNTER — Telehealth: Payer: Self-pay | Admitting: Surgery

## 2020-07-19 NOTE — Telephone Encounter (Signed)
Outgoing call is made, please inform patient of Pre-Admission date/time, COVID Testing date and Surgery date.  Surgery Date: 08/06/20 Preadmission Testing Date: 07/26/20 (phone 1p-5p) Covid Testing Date: 08/02/20 - patient advised to [facility] ([address] [facility]) between 8a-1p  Also patient needs to call [phone], between 1-3:00pm the day before surgery, to find out what time to arrive for surgery.    Also remind patient of bowel prep.

## 2020-07-23 NOTE — Telephone Encounter (Signed)
Outgoing call is made, patient is now informed of all dates regarding his surgery and voices understanding.

## 2020-07-26 ENCOUNTER — Inpatient Hospital Stay
Admission: RE | Admit: 2020-07-26 | Discharge: 2020-07-26 | Disposition: A | Discharge status: Home / Self Care | Payer: Self-pay | Source: Ambulatory Visit

## 2020-07-26 NOTE — Patient Instructions (Signed)
Your procedure is scheduled on:08/06/20- Tuesday Report to Day Surgery on the 2nd [facility]. To find out your arrival time, please call [phone] between 1PM - 3PM on: 08/05/20- Monday Covid Test 08/02/20- Friday- Instructions and soap.  REMEMBER: Instructions that are not followed completely may result in serious medical risk, up to and including death; or upon the discretion of your surgeon and anesthesiologist your surgery may need to be rescheduled.  Do not eat food after midnight the night before surgery.  No gum chewing, lozengers or hard candies.  You may however, drink CLEAR liquids up to 2 hours before you are scheduled to arrive for your surgery. Do not drink anything within 2 hours of your scheduled arrival time.  Clear liquids include: - water  - apple juice without pulp - gatorade (not RED, PURPLE, OR BLUE) - black coffee or tea (Do NOT add milk or creamers to the coffee or tea) Do NOT drink anything that is not on this list.  TAKE THESE MEDICATIONS as instructed  WITH A SIP OF WATER:  - metroNIDAZOLE (FLAGYL) 500 MG tablet -Take 2 tabs at 8 am, take 2 tabs at 2 pm, and take 2 tabs at 8pm the day prior to surgery. - bisacodyl (DULCOLAX) 5 MG EC tablet - Take all 4 tablets at 8 am the morning prior to your surgery. - neomycin (MYCIFRADIN) 500 MG tablet -Take 2 tablet at 8am, take 2 tablets at 2pm, and take 2 tablets at 8pm the day prior to your surgery  - omeprazole (PRILOSEC) 40 MG capsule ,take one the night before and one on the morning of surgery - helps to prevent nausea after surgery.  Use inhaler albuterol (PROVENTIL HFA;VENTOLIN HFA) 108 (90 BASE) MCG/ACT inhaler on the day of surgery and bring to the hospital.  One week prior to surgery: Stop Anti-inflammatories (NSAIDS) such as Advil, Aleve, Ibuprofen, Motrin, Naproxen, Naprosyn and Aspirin based products such as Excedrin, Goodys Powder, BC Powder.  Stop ANY OVER THE COUNTER supplements until  after surgery. (You may continue taking Tylenol, Vitamin D, Vitamin B, and multivitamin.)  No Alcohol for 24 hours before or after surgery.  No Smoking including e-cigarettes for 24 hours prior to surgery.  No chewable tobacco products for at least 6 hours prior to surgery.  No nicotine patches on the day of surgery.  Do not use any "recreational" drugs for at least a week prior to your surgery.  Please be advised that the combination of cocaine and anesthesia may have negative outcomes, up to and including death. If you test positive for cocaine, your surgery will be cancelled.  On the morning of surgery brush your teeth with toothpaste and water, you may rinse your mouth with mouthwash if you wish. Do not swallow any toothpaste or mouthwash.  Do not wear jewelry, make-up, hairpins, clips or nail polish.  Do not wear lotions, powders, or perfumes.   Do not shave 48 hours prior to surgery.   Contact lenses, hearing aids and dentures may not be worn into surgery.  Do not bring valuables to the hospital. [facility] is not responsible for any missing/lost belongings or valuables.   Use CHG Soap or wipes as directed on instruction sheet.  Notify your doctor if there is any change in your medical condition (cold, fever, infection).  Wear comfortable clothing (specific to your surgery type) to the hospital.  Plan for stool softeners for home use; pain medications have a tendency to cause constipation.  You can also help prevent constipation by eating foods high in fiber such as fruits and vegetables and drinking plenty of fluids as your diet allows.  After surgery, you can help prevent lung complications by doing breathing exercises.  Take deep breaths and cough every 1-2 hours. Your doctor may order a device called an Incentive Spirometer to help you take deep breaths. When coughing or sneezing, hold a pillow firmly against your incision with both hands. This is called "splinting."  Doing this helps protect your incision. It also decreases belly discomfort.  If you are being admitted to the hospital overnight, leave your suitcase in the car. After surgery it may be brought to your room.  If you are being discharged the day of surgery, you will not be allowed to drive home. You will need a responsible adult (18 years or older) to drive you home and stay with you that night.   If you are taking public transportation, you will need to have a responsible adult (18 years or older) with you. Please confirm with your physician that it is acceptable to use public transportation.   Please call the Pre-admissions Testing Dept. at [phone] if you have any questions about these instructions.  Visitation Policy:  Patients undergoing a surgery or procedure may have one family member or support person with them as long as that person is not COVID-19 positive or experiencing its symptoms.  That person may remain in the waiting area during the procedure.  Inpatient Visitation Update:   In an effort to ensure the safety of our team members and our patients, we are implementing a change to our visitation policy:  Effective Monday, Aug. 9, at 7 a.m., inpatients will be allowed one support person.  o The support person may change daily.  o The support person must pass our screening, gel in and out, and wear a mask at all times, including in the patient's room.  o Patients must also wear a mask when staff or their support person are in the room.  o Masking is required regardless of vaccination status.  Systemwide, no visitors 17 or younger.      Your procedure is scheduled on: Report to Day Surgery on the 2nd floor of the [facility]. To find out your arrival time, please call [phone] between 1PM - 3PM on:  REMEMBER: Instructions that are not followed completely may result in serious medical risk, up to and including death; or upon the discretion of your  surgeon and anesthesiologist your surgery may need to be rescheduled.  Do not eat food after midnight the night before surgery.  No gum chewing, lozengers or hard candies.  You may however, drink CLEAR liquids up to 2 hours before you are scheduled to arrive for your surgery. Do not drink anything within 2 hours of your scheduled arrival time.  Clear liquids include: - water  - apple juice without pulp - gatorade (not RED, PURPLE, OR BLUE) - black coffee or tea (Do NOT add milk or creamers to the coffee or tea) Do NOT drink anything that is not on this list.  Type 1 and Type 2 diabetics should only drink water.  In addition, your doctor has ordered for you to drink the provided  Ensure Pre-Surgery Clear Carbohydrate Drink  Gatorade G2 Drinking this carbohydrate drink up to two hours before surgery helps to reduce insulin resistance and improve patient outcomes. Please complete drinking 2 hours prior to scheduled arrival time.  TAKE  THESE MEDICATIONS THE MORNING OF SURGERY WITH A SIP OF WATER:  (take one the night before and one on the morning of surgery - helps to prevent nausea after surgery.)  Use inhalers on the day of surgery and bring to the hospital.  Stop Metformin and Janumet 2 days prior to surgery.  Take 1/2 of usual insulin dose the night before surgery and none on the morning of surgery.  Follow recommendations from Cardiologist, Pulmonologist or PCP regarding stopping Aspirin, Coumadin, Plavix, Eliquis, Pradaxa, or Pletal.  One week prior to surgery: Stop Anti-inflammatories (NSAIDS) such as Advil, Aleve, Ibuprofen, Motrin, Naproxen, Naprosyn and Aspirin based products such as Excedrin, Goodys Powder, BC Powder. Stop ANY OVER THE COUNTER supplements until after surgery. (You may continue taking Tylenol, Vitamin D, Vitamin B, and multivitamin.)  No Alcohol for 24 hours before or after surgery.  No Smoking including e-cigarettes for 24 hours prior to surgery.  No  chewable tobacco products for at least 6 hours prior to surgery.  No nicotine patches on the day of surgery.  Do not use any "recreational" drugs for at least a week prior to your surgery.  Please be advised that the combination of cocaine and anesthesia may have negative outcomes, up to and including death. If you test positive for cocaine, your surgery will be cancelled.  On the morning of surgery brush your teeth with toothpaste and water, you may rinse your mouth with mouthwash if you wish. Do not swallow any toothpaste or mouthwash.  Do not wear jewelry, make-up, hairpins, clips or nail polish.  Do not wear lotions, powders, or perfumes.   Do not shave 48 hours prior to surgery.   Contact lenses, hearing aids and dentures may not be worn into surgery.  Do not bring valuables to the hospital. [facility] is not responsible for any missing/lost belongings or valuables.   Use CHG Soap or wipes as directed on instruction sheet.  Total Shoulder Arthroplasty:  use Benzolyl Peroxide 5% Gel as directed on instruction sheet.  Fleets enema or Magnesium Citrate as directed.  Bring your C-PAP to the hospital with you in case you may have to spend the night.   Notify your doctor if there is any change in your medical condition (cold, fever, infection).  Wear comfortable clothing (specific to your surgery type) to the hospital.  Plan for stool softeners for home use; pain medications have a tendency to cause constipation. You can also help prevent constipation by eating foods high in fiber such as fruits and vegetables and drinking plenty of fluids as your diet allows.  After surgery, you can help prevent lung complications by doing breathing exercises.  Take deep breaths and cough every 1-2 hours. Your doctor may order a device called an Incentive Spirometer to help you take deep breaths. When coughing or sneezing, hold a pillow firmly against your incision with both hands. This is  called "splinting." Doing this helps protect your incision. It also decreases belly discomfort.  If you are being admitted to the hospital overnight, leave your suitcase in the car. After surgery it may be brought to your room.  If you are being discharged the day of surgery, you will not be allowed to drive home. You will need a responsible adult (18 years or older) to drive you home and stay with you that night.   If you are taking public transportation, you will need to have a responsible adult (18 years or older) with you. Please confirm with  your physician that it is acceptable to use public transportation.   Please call the Pre-admissions Testing Dept. at [phone] if you have any questions about these instructions.  Visitation Policy:  Patients undergoing a surgery or procedure may have one family member or support person with them as long as that person is not COVID-19 positive or experiencing its symptoms.  That person may remain in the waiting area during the procedure.  Inpatient Visitation Update:   In an effort to ensure the safety of our team members and our patients, we are implementing a change to our visitation policy:  Effective Monday, Aug. 9, at 7 a.m., inpatients will be allowed one support person.  o The support person may change daily.  o The support person must pass our screening, gel in and out, and wear a mask at all times, including in the patient's room.  o Patients must also wear a mask when staff or their support person are in the room.  o Masking is required regardless of vaccination status.  Systemwide, no visitors 17 or younger.

## 2020-07-29 ENCOUNTER — Encounter: Admission: RE | Admit: 2020-07-29 | Payer: Self-pay | Source: Ambulatory Visit

## 2020-07-30 ENCOUNTER — Other Ambulatory Visit: Payer: Self-pay

## 2020-07-30 ENCOUNTER — Encounter
Admission: RE | Admit: 2020-07-30 | Discharge: 2020-07-30 | Disposition: A | Discharge status: Home / Self Care | Payer: Self-pay | Source: Ambulatory Visit | Attending: Surgery | Admitting: Surgery

## 2020-07-30 HISTORY — DX: Gastro-esophageal reflux disease without esophagitis: K21.9

## 2020-07-30 HISTORY — DX: Pneumonia, unspecified organism: J18.9

## 2020-07-30 NOTE — Patient Instructions (Addendum)
Your procedure is scheduled on: Tuesday, November 9 Report to Day Surgery on the 2nd floor of the [facility] To find out your arrival time, please call [phone] between 1PM - 3PM on: Monday, November 8  REMEMBER: Instructions that are not followed completely may result in serious medical risk, up to and including death; or upon the discretion of your surgeon and anesthesiologist your surgery may need to be rescheduled.  Do not eat food after midnight the night before surgery.  No gum chewing, lozengers or hard candies.  You may however, drink water up to 2 hours before you are scheduled to arrive for your surgery. Do not drink anything within 2 hours of your scheduled arrival time.  Follow clear liquid diet and bowel prep instructions given to you by your surgeon.  TAKE THESE MEDICATIONS THE MORNING OF SURGERY WITH A SIP OF WATER:  1.  Prilosec - (take one the night before and one on the morning of surgery - helps to prevent nausea after surgery.) 2.  Albuterol inhaler  Use inhalers on the day of surgery and bring to the hospital.  One week prior to surgery: Stop Anti-inflammatories (NSAIDS) such as Advil, Aleve, Ibuprofen, Motrin, Naproxen, Naprosyn and Aspirin based products such as Excedrin, Goodys Powder, BC Powder. Stop ANY OVER THE COUNTER supplements until after surgery. (You may continue taking multivitamin.)  No Alcohol for 24 hours before or after surgery.  No Smoking including e-cigarettes for 24 hours prior to surgery.  No chewable tobacco products for at least 6 hours prior to surgery.  No nicotine patches on the day of surgery.  Do not use any "recreational" drugs for at least a week prior to your surgery.  Please be advised that the combination of cocaine and anesthesia may have negative outcomes, up to and including death. If you test positive for cocaine, your surgery will be cancelled.  On the morning of surgery brush your teeth with toothpaste and  water, you may rinse your mouth with mouthwash if you wish. Do not swallow any toothpaste or mouthwash.  Do not wear jewelry.  Do not wear lotions, powders, or perfumes.   Do not shave body 48 hours prior to surgery.   Do not bring valuables to the hospital. [facility] is not responsible for any missing/lost belongings or valuables.   Use CHG Soap as directed on instruction sheet.  Notify your doctor if there is any change in your medical condition (cold, fever, infection).  Wear comfortable clothing (specific to your surgery type) to the hospital.  Plan for stool softeners for home use; pain medications have a tendency to cause constipation. You can also help prevent constipation by eating foods high in fiber such as fruits and vegetables and drinking plenty of fluids as your diet allows.  After surgery, you can help prevent lung complications by doing breathing exercises.  Take deep breaths and cough every 1-2 hours. Your doctor may order a device called an Incentive Spirometer to help you take deep breaths. When coughing or sneezing, hold a pillow firmly against your incision with both hands. This is called "splinting." Doing this helps protect your incision. It also decreases belly discomfort.  If you are being admitted to the hospital overnight, leave your suitcase in the car. After surgery it may be brought to your room.  If you are being discharged the day of surgery, you will not be allowed to drive home. You will need a responsible adult (18 years or older) to  drive you home and stay with you that night.   If you are taking public transportation, you will need to have a responsible adult (18 years or older) with you. Please confirm with your physician that it is acceptable to use public transportation.   Please call the Pre-admissions Testing Dept. at [phone] if you have any questions about these instructions.  Visitation Policy:  Patients undergoing a surgery or  procedure may have one family member or support person with them as long as that person is not COVID-19 positive or experiencing its symptoms.  That person may remain in the waiting area during the procedure.  Inpatient Visitation Update:   In an effort to ensure the safety of our team members and our patients, we are implementing a change to our visitation policy:  Effective Monday, Aug. 9, at 7 a.m., inpatients will be allowed one support person.  o The support person may change daily.  o The support person must pass our screening, gel in and out, and wear a mask at all times, including in the patient's room.  o Patients must also wear a mask when staff or their support person are in the room.  o Masking is required regardless of vaccination status.  Systemwide, no visitors 17 or younger.

## 2020-08-02 ENCOUNTER — Other Ambulatory Visit: Payer: Self-pay

## 2020-08-02 ENCOUNTER — Other Ambulatory Visit
Admission: RE | Admit: 2020-08-02 | Discharge: 2020-08-02 | Disposition: A | Discharge status: Home / Self Care | Payer: Self-pay | Source: Ambulatory Visit | Attending: Surgery | Admitting: Surgery

## 2020-08-02 DIAGNOSIS — Z01818 Encounter for other preprocedural examination: Secondary | ICD-10-CM | POA: Insufficient documentation

## 2020-08-02 DIAGNOSIS — Z20822 Contact with and (suspected) exposure to covid-19: Secondary | ICD-10-CM | POA: Insufficient documentation

## 2020-08-02 LAB — CBC WITH DIFFERENTIAL/PLATELET
Abs Immature Granulocytes: 0.02 10*3/uL (ref 0.00–0.07)
Basophils Absolute: 0.1 10*3/uL (ref 0.0–0.1)
Basophils Relative: 1 %
Eosinophils Absolute: 0.2 10*3/uL (ref 0.0–0.5)
Eosinophils Relative: 3 %
HCT: 47.7 % (ref 39.0–52.0)
Hemoglobin: 16.8 g/dL (ref 13.0–17.0)
Immature Granulocytes: 0 %
Lymphocytes Relative: 34 %
Lymphs Abs: 2.5 10*3/uL (ref 0.7–4.0)
MCH: 34.8 pg — ABNORMAL HIGH (ref 26.0–34.0)
MCHC: 35.2 g/dL (ref 30.0–36.0)
MCV: 98.8 fL (ref 80.0–100.0)
Monocytes Absolute: 0.5 10*3/uL (ref 0.1–1.0)
Monocytes Relative: 7 %
Neutro Abs: 4 10*3/uL (ref 1.7–7.7)
Neutrophils Relative %: 55 %
Platelets: 141 10*3/uL — ABNORMAL LOW (ref 150–400)
RBC: 4.83 MIL/uL (ref 4.22–5.81)
RDW: 11.9 % (ref 11.5–15.5)
WBC: 7.3 10*3/uL (ref 4.0–10.5)
nRBC: 0 % (ref 0.0–0.2)

## 2020-08-02 LAB — TYPE AND SCREEN
ABO/RH(D): O POS
Antibody Screen: NEGATIVE

## 2020-08-02 LAB — PROTIME-INR
INR: 1.1 (ref 0.8–1.2)
Prothrombin Time: 13.9 seconds (ref 11.4–15.2)

## 2020-08-02 LAB — COMPREHENSIVE METABOLIC PANEL
ALT: 52 U/L — ABNORMAL HIGH (ref 0–44)
AST: 77 U/L — ABNORMAL HIGH (ref 15–41)
Albumin: 3.9 g/dL (ref 3.5–5.0)
Alkaline Phosphatase: 70 U/L (ref 38–126)
Anion gap: 13 (ref 5–15)
BUN: 5 mg/dL — ABNORMAL LOW (ref 6–20)
CO2: 25 mmol/L (ref 22–32)
Calcium: 8.5 mg/dL — ABNORMAL LOW (ref 8.9–10.3)
Chloride: 107 mmol/L (ref 98–111)
Creatinine, Ser: 0.68 mg/dL (ref 0.61–1.24)
GFR, Estimated: >60 mL/min (ref >60)
Glucose, Bld: 117 mg/dL — ABNORMAL HIGH (ref 70–99)
Potassium: 3.3 mmol/L — ABNORMAL LOW (ref 3.5–5.1)
Sodium: 145 mmol/L (ref 135–145)
Total Bilirubin: 1.1 mg/dL (ref 0.3–1.2)
Total Protein: 7.5 g/dL (ref 6.5–8.1)

## 2020-08-02 LAB — SARS CORONAVIRUS 2 (TAT 6-24 HRS): SARS Coronavirus 2: NEGATIVE

## 2020-08-02 NOTE — Pre-Procedure Instructions (Signed)
Attempted to contact patient via phone without success. Left a voice message on generic machine indicating his appt for Covid test and blood work today, prior to 1 pm.

## 2020-08-06 ENCOUNTER — Inpatient Hospital Stay: Payer: Self-pay | Admitting: Anesthesiology

## 2020-08-06 ENCOUNTER — Encounter: Payer: Self-pay | Admitting: Surgery

## 2020-08-06 ENCOUNTER — Other Ambulatory Visit: Payer: Self-pay

## 2020-08-06 ENCOUNTER — Inpatient Hospital Stay
Admission: RE | Admit: 2020-08-06 | Discharge: 2020-08-09 | DRG: 331 | Disposition: A | Discharge status: Home / Self Care | Payer: Self-pay | Attending: Surgery | Admitting: Surgery

## 2020-08-06 ENCOUNTER — Encounter
Admission: RE | Disposition: A | Discharge status: Home / Self Care | Payer: Self-pay | Source: Home / Self Care | Attending: Surgery

## 2020-08-06 DIAGNOSIS — K760 Fatty (change of) liver, not elsewhere classified: Secondary | ICD-10-CM | POA: Diagnosis present

## 2020-08-06 DIAGNOSIS — K5720 Diverticulitis of large intestine with perforation and abscess without bleeding: Principal | ICD-10-CM | POA: Diagnosis present

## 2020-08-06 DIAGNOSIS — Z9109 Other allergy status, other than to drugs and biological substances: Secondary | ICD-10-CM

## 2020-08-06 DIAGNOSIS — Z9049 Acquired absence of other specified parts of digestive tract: Secondary | ICD-10-CM

## 2020-08-06 DIAGNOSIS — K660 Peritoneal adhesions (postprocedural) (postinfection): Secondary | ICD-10-CM | POA: Diagnosis present

## 2020-08-06 DIAGNOSIS — Z6835 Body mass index (BMI) 35.0-35.9, adult: Secondary | ICD-10-CM

## 2020-08-06 DIAGNOSIS — E669 Obesity, unspecified: Secondary | ICD-10-CM | POA: Diagnosis present

## 2020-08-06 DIAGNOSIS — Z885 Allergy status to narcotic agent status: Secondary | ICD-10-CM

## 2020-08-06 DIAGNOSIS — Z408 Encounter for other prophylactic surgery: Secondary | ICD-10-CM

## 2020-08-06 DIAGNOSIS — J45909 Unspecified asthma, uncomplicated: Secondary | ICD-10-CM | POA: Diagnosis present

## 2020-08-06 DIAGNOSIS — Z79899 Other long term (current) drug therapy: Secondary | ICD-10-CM

## 2020-08-06 HISTORY — PX: CYSTOSCOPY: SHX5120

## 2020-08-06 HISTORY — PX: LAPAROSCOPIC RIGHT COLECTOMY: SHX5925

## 2020-08-06 LAB — URINE DRUG SCREEN, QUALITATIVE (ARMC ONLY)
Amphetamines, Ur Screen: NOT DETECTED
Barbiturates, Ur Screen: NOT DETECTED
Benzodiazepine, Ur Scrn: NOT DETECTED
Cocaine Metabolite,Ur ~~LOC~~: NOT DETECTED
MDMA (Ecstasy)Ur Screen: NOT DETECTED
Methadone Scn, Ur: NOT DETECTED
Opiate, Ur Screen: NOT DETECTED
Phencyclidine (PCP) Ur S: NOT DETECTED
Tricyclic, Ur Screen: NOT DETECTED

## 2020-08-06 LAB — ABO/RH: ABO/RH(D): O POS

## 2020-08-06 SURGERY — COLECTOMY, RIGHT, LAPAROSCOPIC
Anesthesia: General | Laterality: Right

## 2020-08-06 MED ORDER — ALVIMOPAN 12 MG PO CAPS: 12.0000 mg | ORAL_CAPSULE | ORAL | Status: AC

## 2020-08-06 MED ORDER — FENTANYL CITRATE (PF) 100 MCG/2ML IJ SOLN
INTRAMUSCULAR | Status: AC
  Filled 2020-08-06: qty 2

## 2020-08-06 MED ORDER — SODIUM CHLORIDE 0.9 % IV SOLN
2.0000 g | INTRAVENOUS | Status: AC
  Administered 2020-08-06: 2 g via INTRAVENOUS

## 2020-08-06 MED ORDER — LACTATED RINGERS IV SOLN: INTRAVENOUS | Status: DC

## 2020-08-06 MED ORDER — SODIUM CHLORIDE FLUSH 0.9 % IV SOLN
INTRAVENOUS | Status: AC
  Filled 2020-08-06: qty 10

## 2020-08-06 MED ORDER — PROPOFOL 10 MG/ML IV BOLUS
INTRAVENOUS | Status: AC
  Filled 2020-08-06: qty 20

## 2020-08-06 MED ORDER — BUPIVACAINE LIPOSOME 1.3 % IJ SUSP
INTRAMUSCULAR | Status: DC | PRN
  Administered 2020-08-06: 20 mL

## 2020-08-06 MED ORDER — ACETAMINOPHEN 500 MG PO TABS
1000.0000 mg | ORAL_TABLET | Freq: Four times a day (QID) | ORAL | Status: DC
  Administered 2020-08-06 – 2020-08-09 (×11): 1000 mg via ORAL
  Filled 2020-08-06 (×11): qty 2

## 2020-08-06 MED ORDER — KETAMINE HCL 50 MG/ML IJ SOLN
INTRAMUSCULAR | Status: DC | PRN
  Administered 2020-08-06 (×2): 50 mg via INTRAMUSCULAR

## 2020-08-06 MED ORDER — SEVOFLURANE IN SOLN
RESPIRATORY_TRACT | Status: AC
  Filled 2020-08-06: qty 250

## 2020-08-06 MED ORDER — OXYCODONE HCL 5 MG PO TABS: 5.0000 mg | ORAL_TABLET | Freq: Once | ORAL | Status: DC | PRN

## 2020-08-06 MED ORDER — FENTANYL CITRATE (PF) 100 MCG/2ML IJ SOLN
INTRAMUSCULAR | Status: DC | PRN
  Administered 2020-08-06: 50 ug via INTRAVENOUS
  Administered 2020-08-06: 100 ug via INTRAVENOUS
  Administered 2020-08-06: 50 ug via INTRAVENOUS

## 2020-08-06 MED ORDER — HYDROMORPHONE HCL 1 MG/ML IJ SOLN
1.0000 mg | INTRAMUSCULAR | Status: DC | PRN
  Administered 2020-08-06 – 2020-08-09 (×12): 1 mg via INTRAVENOUS
  Filled 2020-08-06 (×12): qty 1

## 2020-08-06 MED ORDER — CHLORHEXIDINE GLUCONATE 0.12 % MT SOLN: 15.0000 mL | Freq: Once | OROMUCOSAL | Status: AC

## 2020-08-06 MED ORDER — IPRATROPIUM-ALBUTEROL 0.5-2.5 (3) MG/3ML IN SOLN
RESPIRATORY_TRACT | Status: AC
  Filled 2020-08-06: qty 3

## 2020-08-06 MED ORDER — ONDANSETRON HCL 4 MG/2ML IJ SOLN: 4.0000 mg | Freq: Four times a day (QID) | INTRAMUSCULAR | Status: DC | PRN

## 2020-08-06 MED ORDER — BUPIVACAINE LIPOSOME 1.3 % IJ SUSP
INTRAMUSCULAR | Status: AC
  Filled 2020-08-06: qty 20

## 2020-08-06 MED ORDER — GLYCOPYRROLATE 0.2 MG/ML IJ SOLN
INTRAMUSCULAR | Status: DC | PRN
  Administered 2020-08-06: .2 mg via INTRAVENOUS

## 2020-08-06 MED ORDER — PROPOFOL 10 MG/ML IV BOLUS
INTRAVENOUS | Status: DC | PRN
  Administered 2020-08-06: 200 mg via INTRAVENOUS

## 2020-08-06 MED ORDER — LIDOCAINE HCL (CARDIAC) PF 100 MG/5ML IV SOSY
PREFILLED_SYRINGE | INTRAVENOUS | Status: DC | PRN
  Administered 2020-08-06 (×2): 20 mg via INTRAVENOUS
  Administered 2020-08-06: 100 mg via INTRAVENOUS
  Administered 2020-08-06: 20 mg via INTRAVENOUS

## 2020-08-06 MED ORDER — HYDROMORPHONE HCL 1 MG/ML IJ SOLN
INTRAMUSCULAR | Status: DC | PRN
Start: 2020-08-06 — End: 2020-08-06
  Administered 2020-08-06 (×2): .5 mg via INTRAVENOUS

## 2020-08-06 MED ORDER — SODIUM CHLORIDE 0.9 % IV SOLN
2.0000 g | Freq: Three times a day (TID) | INTRAVENOUS | Status: AC
  Administered 2020-08-06 – 2020-08-07 (×3): 2 g via INTRAVENOUS
  Filled 2020-08-06 (×3): qty 2

## 2020-08-06 MED ORDER — SUGAMMADEX SODIUM 500 MG/5ML IV SOLN
INTRAVENOUS | Status: AC
  Filled 2020-08-06: qty 5

## 2020-08-06 MED ORDER — LIDOCAINE HCL (PF) 2 % IJ SOLN
INTRAMUSCULAR | Status: AC
  Filled 2020-08-06: qty 5

## 2020-08-06 MED ORDER — ALVIMOPAN 12 MG PO CAPS
ORAL_CAPSULE | ORAL | Status: AC
  Administered 2020-08-06: 12 mg via ORAL
  Filled 2020-08-06: qty 1

## 2020-08-06 MED ORDER — PROMETHAZINE HCL 25 MG/ML IJ SOLN: 6.2500 mg | INTRAMUSCULAR | Status: DC | PRN

## 2020-08-06 MED ORDER — KETOROLAC TROMETHAMINE 30 MG/ML IJ SOLN
30.0000 mg | Freq: Four times a day (QID) | INTRAMUSCULAR | Status: DC
  Administered 2020-08-06 – 2020-08-09 (×12): 30 mg via INTRAVENOUS
  Filled 2020-08-06 (×12): qty 1

## 2020-08-06 MED ORDER — HYDROMORPHONE HCL 1 MG/ML IJ SOLN
INTRAMUSCULAR | Status: AC
  Filled 2020-08-06: qty 1

## 2020-08-06 MED ORDER — ONDANSETRON HCL 4 MG/2ML IJ SOLN
INTRAMUSCULAR | Status: DC | PRN
  Administered 2020-08-06: 4 mg via INTRAVENOUS

## 2020-08-06 MED ORDER — ROCURONIUM BROMIDE 10 MG/ML (PF) SYRINGE
PREFILLED_SYRINGE | INTRAVENOUS | Status: AC
  Filled 2020-08-06: qty 10

## 2020-08-06 MED ORDER — DEXMEDETOMIDINE (PRECEDEX) IN NS 20 MCG/5ML (4 MCG/ML) IV SYRINGE
PREFILLED_SYRINGE | INTRAVENOUS | Status: AC
  Filled 2020-08-06: qty 5

## 2020-08-06 MED ORDER — BUPIVACAINE-EPINEPHRINE (PF) 0.5% -1:200000 IJ SOLN
INTRAMUSCULAR | Status: AC
  Filled 2020-08-06: qty 30

## 2020-08-06 MED ORDER — MIDAZOLAM HCL 2 MG/2ML IJ SOLN
INTRAMUSCULAR | Status: AC
  Filled 2020-08-06: qty 2

## 2020-08-06 MED ORDER — PHENYLEPHRINE HCL (PRESSORS) 10 MG/ML IV SOLN
INTRAVENOUS | Status: AC
  Filled 2020-08-06: qty 1

## 2020-08-06 MED ORDER — CHLORHEXIDINE GLUCONATE 0.12 % MT SOLN
OROMUCOSAL | Status: AC
  Administered 2020-08-06: 15 mL via OROMUCOSAL
  Filled 2020-08-06: qty 15

## 2020-08-06 MED ORDER — MEPERIDINE HCL 50 MG/ML IJ SOLN: 6.2500 mg | INTRAMUSCULAR | Status: DC | PRN

## 2020-08-06 MED ORDER — DEXAMETHASONE SODIUM PHOSPHATE 10 MG/ML IJ SOLN
INTRAMUSCULAR | Status: AC
  Filled 2020-08-06: qty 1

## 2020-08-06 MED ORDER — OXYCODONE HCL 5 MG PO TABS
5.0000 mg | ORAL_TABLET | ORAL | Status: DC | PRN
  Administered 2020-08-06 – 2020-08-08 (×11): 10 mg via ORAL
  Administered 2020-08-08: 5 mg via ORAL
  Administered 2020-08-09: 10 mg via ORAL
  Administered 2020-08-09: 5 mg via ORAL
  Administered 2020-08-09: 10 mg via ORAL
  Filled 2020-08-06 (×4): qty 2
  Filled 2020-08-06 (×2): qty 1
  Filled 2020-08-06 (×9): qty 2

## 2020-08-06 MED ORDER — ORAL CARE MOUTH RINSE: 15.0000 mL | Freq: Once | OROMUCOSAL | Status: AC

## 2020-08-06 MED ORDER — ALVIMOPAN 12 MG PO CAPS
12.0000 mg | ORAL_CAPSULE | Freq: Two times a day (BID) | ORAL | Status: DC
  Administered 2020-08-07 (×2): 12 mg via ORAL
  Filled 2020-08-06 (×3): qty 1

## 2020-08-06 MED ORDER — SODIUM CHLORIDE 0.9 % IV SOLN
INTRAVENOUS | Status: AC
  Filled 2020-08-06: qty 2

## 2020-08-06 MED ORDER — GABAPENTIN 300 MG PO CAPS: 300.0000 mg | ORAL_CAPSULE | ORAL | Status: AC

## 2020-08-06 MED ORDER — PANTOPRAZOLE SODIUM 40 MG IV SOLR
40.0000 mg | Freq: Every day | INTRAVENOUS | Status: DC
  Administered 2020-08-06: 40 mg via INTRAVENOUS
  Filled 2020-08-06: qty 40

## 2020-08-06 MED ORDER — ONDANSETRON 4 MG PO TBDP: 4.0000 mg | ORAL_TABLET | Freq: Four times a day (QID) | ORAL | Status: DC | PRN

## 2020-08-06 MED ORDER — DEXMEDETOMIDINE (PRECEDEX) IN NS 20 MCG/5ML (4 MCG/ML) IV SYRINGE
PREFILLED_SYRINGE | INTRAVENOUS | Status: DC | PRN
  Administered 2020-08-06: 4 ug via INTRAVENOUS
  Administered 2020-08-06 (×2): 8 ug via INTRAVENOUS

## 2020-08-06 MED ORDER — GABAPENTIN 300 MG PO CAPS
ORAL_CAPSULE | ORAL | Status: AC
  Administered 2020-08-06: 300 mg via ORAL
  Filled 2020-08-06: qty 1

## 2020-08-06 MED ORDER — DEXAMETHASONE SODIUM PHOSPHATE 10 MG/ML IJ SOLN
INTRAMUSCULAR | Status: DC | PRN
  Administered 2020-08-06: 10 mg via INTRAVENOUS

## 2020-08-06 MED ORDER — GLYCOPYRROLATE 0.2 MG/ML IJ SOLN
INTRAMUSCULAR | Status: AC
  Filled 2020-08-06: qty 1

## 2020-08-06 MED ORDER — ENOXAPARIN SODIUM 40 MG/0.4ML ~~LOC~~ SOLN
40.0000 mg | SUBCUTANEOUS | Status: DC
  Administered 2020-08-07 – 2020-08-09 (×3): 40 mg via SUBCUTANEOUS
  Filled 2020-08-06 (×3): qty 0.4

## 2020-08-06 MED ORDER — ACETAMINOPHEN 500 MG PO TABS
ORAL_TABLET | ORAL | Status: AC
  Administered 2020-08-06: 1000 mg via ORAL
  Filled 2020-08-06: qty 2

## 2020-08-06 MED ORDER — CHLORHEXIDINE GLUCONATE CLOTH 2 % EX PADS: 6.0000 | MEDICATED_PAD | Freq: Once | CUTANEOUS | Status: DC

## 2020-08-06 MED ORDER — ROCURONIUM BROMIDE 100 MG/10ML IV SOLN
INTRAVENOUS | Status: DC | PRN
  Administered 2020-08-06: 30 mg via INTRAVENOUS
  Administered 2020-08-06: 20 mg via INTRAVENOUS
  Administered 2020-08-06: 30 mg via INTRAVENOUS
  Administered 2020-08-06: 50 mg via INTRAVENOUS
  Administered 2020-08-06: 20 mg via INTRAVENOUS
  Administered 2020-08-06: 50 mg via INTRAVENOUS

## 2020-08-06 MED ORDER — ACETAMINOPHEN 500 MG PO TABS: 1000.0000 mg | ORAL_TABLET | ORAL | Status: AC

## 2020-08-06 MED ORDER — FENTANYL CITRATE (PF) 100 MCG/2ML IJ SOLN: 25.0000 ug | INTRAMUSCULAR | Status: DC | PRN

## 2020-08-06 MED ORDER — OXYCODONE HCL 5 MG/5ML PO SOLN: 5.0000 mg | Freq: Once | ORAL | Status: DC | PRN

## 2020-08-06 MED ORDER — ONDANSETRON HCL 4 MG/2ML IJ SOLN
INTRAMUSCULAR | Status: AC
  Filled 2020-08-06: qty 2

## 2020-08-06 MED ORDER — BUPIVACAINE-EPINEPHRINE (PF) 0.5% -1:200000 IJ SOLN
INTRAMUSCULAR | Status: DC | PRN
  Administered 2020-08-06: 30 mL

## 2020-08-06 MED ORDER — SUGAMMADEX SODIUM 500 MG/5ML IV SOLN
INTRAVENOUS | Status: DC | PRN
  Administered 2020-08-06: 220 mg via INTRAVENOUS

## 2020-08-06 MED ORDER — HYDROMORPHONE HCL 1 MG/ML IJ SOLN
0.5000 mg | INTRAMUSCULAR | Status: DC | PRN
  Administered 2020-08-06: 0.5 mg via INTRAVENOUS
  Filled 2020-08-06: qty 0.5

## 2020-08-06 MED ORDER — MIDAZOLAM HCL 2 MG/2ML IJ SOLN
INTRAMUSCULAR | Status: DC | PRN
  Administered 2020-08-06: 2 mg via INTRAVENOUS

## 2020-08-06 MED ORDER — SODIUM CHLORIDE (PF) 0.9 % IJ SOLN
INTRAMUSCULAR | Status: DC | PRN
  Administered 2020-08-06: 30 mL

## 2020-08-06 MED ORDER — BUPIVACAINE LIPOSOME 1.3 % IJ SUSP: 20.0000 mL | Freq: Once | INTRAMUSCULAR | Status: DC

## 2020-08-06 MED ORDER — KETAMINE HCL 50 MG/ML IJ SOLN
INTRAMUSCULAR | Status: AC
  Filled 2020-08-06: qty 10

## 2020-08-06 SURGICAL SUPPLY — 102 items
"PENCIL ELECTRO HAND CTR " (MISCELLANEOUS) ×2 IMPLANT
ADAPTER CATH URET CONN 4-6FR (ADAPTER) IMPLANT
ADAPTER GOLDBERG URETERAL (ADAPTER) IMPLANT
ADPR CATH 15X14FR FL DRN BG (ADAPTER)
ADPR CATH 3-6FR M LL SDARM Y (MISCELLANEOUS)
APL PRP STRL LF DISP 70% ISPRP (MISCELLANEOUS) ×2
BAG BILE T-TUBES STRL (MISCELLANEOUS) IMPLANT
BAG DRAIN CYSTO-URO LG1000N (MISCELLANEOUS) ×4 IMPLANT
BAG DRN 9.5 2 ADJ BELT ADPR (MISCELLANEOUS)
BAG DRN RND TRDRP ANRFLXCHMBR (UROLOGICAL SUPPLIES) ×2
BAG URINE DRAIN 2000ML AR STRL (UROLOGICAL SUPPLIES) ×4 IMPLANT
BLADE SURG SZ10 CARB STEEL (BLADE) ×4 IMPLANT
BRUSH SCRUB EZ  4% CHG (MISCELLANEOUS) ×4
BRUSH SCRUB EZ 4% CHG (MISCELLANEOUS) ×2 IMPLANT
CANISTER SUCT 1200ML W/VALVE (MISCELLANEOUS) ×4 IMPLANT
CATH FOLEY 2WAY  5CC 16FR (CATHETERS)
CATH FOLEY 2WAY 5CC 16FR (CATHETERS)
CATH FOLEY SIL 2WAY 14FR5CC (CATHETERS) IMPLANT
CATH URETL 5X70 OPEN END (CATHETERS) ×6 IMPLANT
CATH URTH 16FR FL 2W BLN LF (CATHETERS) IMPLANT
CHLORAPREP W/TINT 26 (MISCELLANEOUS) ×4 IMPLANT
CONRAY 43 FOR UROLOGY 50M (MISCELLANEOUS) ×4 IMPLANT
COVER WAND RF STERILE (DRAPES) ×8 IMPLANT
DECANTER SPIKE VIAL GLASS SM (MISCELLANEOUS) ×4 IMPLANT
DEFOGGER SCOPE WARMER CLEARIFY (MISCELLANEOUS) ×2 IMPLANT
DRSG OPSITE POSTOP 3X4 (GAUZE/BANDAGES/DRESSINGS) ×12 IMPLANT
DRSG OPSITE POSTOP 4X8 (GAUZE/BANDAGES/DRESSINGS) ×4 IMPLANT
ELECT CAUTERY BLADE 6.4 (BLADE) ×4 IMPLANT
ELECT REM PT RETURN 9FT ADLT (ELECTROSURGICAL) ×4
ELECTRODE REM PT RTRN 9FT ADLT (ELECTROSURGICAL) ×2 IMPLANT
GLOVE BIOGEL PI IND STRL 7.5 (GLOVE) ×2 IMPLANT
GLOVE BIOGEL PI INDICATOR 7.5 (GLOVE) ×2
GLOVE SURG SYN 7.0 (GLOVE) ×8 IMPLANT
GLOVE SURG SYN 7.0 PF PI (GLOVE) ×4 IMPLANT
GLOVE SURG SYN 7.5  E (GLOVE) ×8
GLOVE SURG SYN 7.5 E (GLOVE) ×4 IMPLANT
GLOVE SURG SYN 7.5 PF PI (GLOVE) ×4 IMPLANT
GOWN STRL REUS W/ TWL LRG LVL3 (GOWN DISPOSABLE) ×8 IMPLANT
GOWN STRL REUS W/ TWL XL LVL3 (GOWN DISPOSABLE) ×2 IMPLANT
GOWN STRL REUS W/TWL LRG LVL3 (GOWN DISPOSABLE) ×20
GOWN STRL REUS W/TWL XL LVL3 (GOWN DISPOSABLE) ×4
GUIDEWIRE STR DUAL SENSOR (WIRE) ×4 IMPLANT
HANDLE YANKAUER SUCT BULB TIP (MISCELLANEOUS) ×4 IMPLANT
HEMOSTAT ARISTA ABSORB 3G PWDR (HEMOSTASIS) ×2 IMPLANT
HOLDER FOLEY CATH W/STRAP (MISCELLANEOUS) ×4 IMPLANT
IRRIGATION STRYKERFLOW (MISCELLANEOUS) ×2 IMPLANT
IRRIGATOR STRYKERFLOW (MISCELLANEOUS) ×4
IV NS 1000ML (IV SOLUTION) ×4
IV NS 1000ML BAXH (IV SOLUTION) ×2 IMPLANT
KIT TURNOVER CYSTO (KITS) ×4 IMPLANT
KIT TURNOVER KIT A (KITS) ×4 IMPLANT
LABEL OR SOLS (LABEL) ×4 IMPLANT
LIGASURE IMPACT 36 18CM CVD LR (INSTRUMENTS) ×2 IMPLANT
LIGASURE LAP MARYLAND 5MM 37CM (ELECTROSURGICAL) ×4 IMPLANT
MANIFOLD NEPTUNE II (INSTRUMENTS) ×8 IMPLANT
NEEDLE HYPO 22GX1.5 SAFETY (NEEDLE) ×4 IMPLANT
NS IRRIG 1000ML POUR BTL (IV SOLUTION) ×4 IMPLANT
PACK COLON CLEAN CLOSURE (MISCELLANEOUS) ×4 IMPLANT
PACK CYSTO AR (MISCELLANEOUS) ×4 IMPLANT
PACK LAP CHOLECYSTECTOMY (MISCELLANEOUS) ×4 IMPLANT
PENCIL ELECTRO HAND CTR (MISCELLANEOUS) ×4 IMPLANT
RELOAD PROXIMATE 75MM BLUE (ENDOMECHANICALS) ×12 IMPLANT
RELOAD STAPLE 75 3.8 BLU REG (ENDOMECHANICALS) IMPLANT
RELOAD STAPLER LINEAR PROX 30 (STAPLE) IMPLANT
RETRACTOR WOUND ALXS 18CM MED (MISCELLANEOUS) IMPLANT
RTRCTR WOUND ALEXIS O 18CM MED (MISCELLANEOUS)
SET CYSTO W/LG BORE CLAMP LF (SET/KITS/TRAYS/PACK) ×4 IMPLANT
SIDE-ARM FITTING (MISCELLANEOUS) IMPLANT
SLEEVE ADV FIXATION 5X100MM (TROCAR) ×12 IMPLANT
SOL .9 NS 3000ML IRR  AL (IV SOLUTION) ×4
SOL .9 NS 3000ML IRR AL (IV SOLUTION) ×2
SOL .9 NS 3000ML IRR UROMATIC (IV SOLUTION) ×2 IMPLANT
SPONGE LAP 18X18 RF (DISPOSABLE) ×8 IMPLANT
STAPLER GUN LINEAR PROX 60 (STAPLE) IMPLANT
STAPLER PROXIMATE 75MM BLUE (STAPLE) ×2 IMPLANT
STAPLER RELOAD LINEAR PROX 30 (STAPLE)
STAPLER RELOADABLE 30 BLU REG (STAPLE) IMPLANT
STAPLER SKIN PROX 35W (STAPLE) IMPLANT
STENT URET 6FRX24 CONTOUR (STENTS) IMPLANT
STENT URET 6FRX26 CONTOUR (STENTS) IMPLANT
SURGILUBE 2OZ TUBE FLIPTOP (MISCELLANEOUS) ×4 IMPLANT
SUT MNCRL 3-0 UNDYED SH (SUTURE) ×2 IMPLANT
SUT MONOCRYL 3-0 UNDYED (SUTURE) ×4
SUT PDS AB 1 CT1 36 (SUTURE) ×12 IMPLANT
SUT PDS AB 1 TP1 96 (SUTURE) ×8 IMPLANT
SUT SILK 0 (SUTURE)
SUT SILK 0 30XBRD TIE 6 (SUTURE) IMPLANT
SUT SILK 2-0 (SUTURE) ×4 IMPLANT
SUT SILK 3-0 (SUTURE) ×6 IMPLANT
SUT VIC AB 3-0 SH 27 (SUTURE) ×8
SUT VIC AB 3-0 SH 27X BRD (SUTURE) IMPLANT
SUT VICRYL 0 AB UR-6 (SUTURE) ×4 IMPLANT
SYR TOOMEY IRRIG 70ML (MISCELLANEOUS) ×4
SYRINGE TOOMEY IRRIG 70ML (MISCELLANEOUS) ×2 IMPLANT
SYS KII FIOS ACCESS ABD 5X100 (TROCAR) ×4
SYSTEM KII FIOS ACES ABD 5X100 (TROCAR) ×2 IMPLANT
TRAY FOLEY MTR SLVR 16FR STAT (SET/KITS/TRAYS/PACK) ×4 IMPLANT
TROCAR BALLN GELPORT 12X130M (ENDOMECHANICALS) ×4 IMPLANT
TROCAR Z-THREAD FIOS 11X100 BL (TROCAR) IMPLANT
TUBING ART PRESS 48 MALE/FEM (TUBING) IMPLANT
TUBING EVAC SMOKE HEATED PNEUM (TUBING) ×4 IMPLANT
WATER STERILE IRR 1000ML POUR (IV SOLUTION) ×4 IMPLANT

## 2020-08-06 NOTE — Anesthesia Procedure Notes (Signed)
Procedure Name: Intubation Date/Time: 08/06/2020 7:46 AM Performed by: Omer Jack, CRNA Pre-anesthesia Checklist: Patient identified, Patient being monitored, Timeout performed, Emergency Drugs available and Suction available Patient Re-evaluated:Patient Re-evaluated prior to induction Oxygen Delivery Method: Circle system utilized Preoxygenation: Pre-oxygenation with 100% oxygen Induction Type: IV induction Ventilation: Mask ventilation without difficulty Laryngoscope Size: 3 and McGraph Grade View: Grade I Tube type: Oral Tube size: 7.5 mm Number of attempts: 1 Airway Equipment and Method: Stylet Placement Confirmation: ETT inserted through vocal cords under direct vision,  positive ETCO2 and breath sounds checked- equal and bilateral Secured at: 22 cm Tube secured with: Tape Dental Injury: Teeth and Oropharynx as per pre-operative assessment  Comments: Pt with large beard. Use two handed mask while second provider bagged

## 2020-08-06 NOTE — Brief Op Note (Signed)
08/06/2020  1:41 PM  PATIENT:  Verna Czech  29 y.o. male  PRE-OPERATIVE DIAGNOSIS:  Cecal diverticulitis with abscess  POST-OPERATIVE DIAGNOSIS:  Cecal diverticulitis with abscess  PROCEDURE:  Procedure(s): LAPAROSCOPIC RIGHT COLECTOMY (Right) CYSTOSCOPY ICG (N/A)  SURGEON:  Surgeon(s) and Role: Panel 1:    * Kearney Evitt, MD - Primary    * Hulda Marin, MD - Assisting Panel 2:    * Lonna Cobb, Verna Czech, MD - Primary  ASSISTANTS: Eugenia Pancoast, PA-S   ANESTHESIA:   general  EBL:  150 mL   BLOOD ADMINISTERED:none  DRAINS: none   LOCAL MEDICATIONS USED:  BUPIVICAINE   SPECIMEN:  Source of Specimen:  Right colon with terminal ileum  DISPOSITION OF SPECIMEN:  PATHOLOGY  COUNTS:  YES  DICTATION: .Dragon Dictation  PLAN OF CARE: Admit to inpatient   PATIENT DISPOSITION:  PACU - hemodynamically stable.   Delay start of Pharmacological VTE agent (>24hrs) due to surgical blood loss or risk of bleeding: yes

## 2020-08-06 NOTE — Anesthesia Postprocedure Evaluation (Signed)
Anesthesia Post Note  Patient: Jacob Huerta  Procedure(s) Performed: LAPAROSCOPIC RIGHT COLECTOMY (Right ) CYSTOSCOPY ICG (N/A )  Patient location during evaluation: PACU Anesthesia Type: General Level of consciousness: awake and alert and oriented Pain management: pain level controlled Vital Signs Assessment: post-procedure vital signs reviewed and stable Respiratory status: spontaneous breathing, nonlabored ventilation and respiratory function stable Cardiovascular status: blood pressure returned to baseline and stable Postop Assessment: no signs of nausea or vomiting Anesthetic complications: no   No complications documented.   Last Vitals:  Vitals:   08/06/20 1426 08/06/20 1428  BP:    Pulse: (!) 114 (!) 105  Resp: 20 (!) 23  Temp:    SpO2: 91% 96%    Last Pain:  Vitals:   08/06/20 1428  TempSrc:   PainSc: 0-No pain                 Taiwan Talcott

## 2020-08-06 NOTE — Interval H&P Note (Signed)
History and Physical Interval Note:  08/06/2020 7:05 AM  Jacob Huerta  has presented today for surgery, with the diagnosis of Cecal diverticulitis with abscess.  The various methods of treatment have been discussed with the patient and family. After consideration of risks, benefits and other options for treatment, the patient has consented to  Procedure(s): LAPAROSCOPIC RIGHT COLECTOMY (Right) CYSTOSCOPY ICG (N/A) as a surgical intervention.  The patient's history has been reviewed, patient examined, no change in status, stable for surgery.  I have reviewed the patient's chart and labs.  Questions were answered to the patient's satisfaction.     Jacob Huerta

## 2020-08-06 NOTE — Anesthesia Preprocedure Evaluation (Signed)
Anesthesia Evaluation  Patient identified by MRN, date of birth, ID band Patient awake    Reviewed: Allergy & Precautions, NPO status , Patient's Chart, lab work & pertinent test results  History of Anesthesia Complications Negative for: history of anesthetic complications  Airway Mallampati: I  TM Distance: >3 FB Neck ROM: Full    Dental no notable dental hx.    Pulmonary asthma (mild intermittent) , Current Smoker and Patient abstained from smoking.,    breath sounds clear to auscultation- rhonchi (-) wheezing      Cardiovascular Exercise Tolerance: Good (-) hypertension(-) CAD, (-) Past MI, (-) Cardiac Stents and (-) CABG  Rhythm:Regular Rate:Normal - Systolic murmurs and - Diastolic murmurs    Neuro/Psych neg Seizures negative neurological ROS  negative psych ROS   GI/Hepatic Neg liver ROS, GERD  ,  Endo/Other  negative endocrine ROSneg diabetes  Renal/GU negative Renal ROS     Musculoskeletal negative musculoskeletal ROS (+)   Abdominal (+) + obese,   Peds  Hematology negative hematology ROS (+)   Anesthesia Other Findings Past Medical History: No date: Acute appendicitis No date: Asthma 01/04/2020: Cecal diverticulitis     Comment:  with abscess, s/p drainage 01/08/20 No date: GERD (gastroesophageal reflux disease) No date: Pneumonia   Reproductive/Obstetrics                             Anesthesia Physical Anesthesia Plan  ASA: II  Anesthesia Plan: General   Post-op Pain Management:    Induction: Intravenous  PONV Risk Score and Plan: 0  Airway Management Planned: Oral ETT  Additional Equipment:   Intra-op Plan:   Post-operative Plan: Extubation in OR  Informed Consent: I have reviewed the patients History and Physical, chart, labs and discussed the procedure including the risks, benefits and alternatives for the proposed anesthesia with the patient or authorized  representative who has indicated his/her understanding and acceptance.     Dental advisory given  Plan Discussed with: CRNA and Anesthesiologist  Anesthesia Plan Comments:         Anesthesia Quick Evaluation

## 2020-08-06 NOTE — Op Note (Signed)
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  His assistance was required due to the complexity of the case, converting to open surgery, exposure, and anastomosis.  Anesthesia:  General endotracheal  Estimated Blood Loss:  150 ml  Specimens:  Right colon with terminal ileum  Complications:  None  Indications for Procedure:  This is a 29 y.o. 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  The benefits, complications, treatment options, and expected outcomes were discussed with the patient. The risks of bleeding, infection, bowel injury, and need for further procedures were all discussed with the patient and he was willing to proceed.  Description of Procedure: The patient was correctly identified in the preoperative area and brought into the operating room.  The patient was placed supine with VTE prophylaxis in place.  Appropriate time-outs were performed.  Anesthesia was induced and the patient was intubated.  Foley catheter was placed.  Appropriate antibiotics were infused.  The patient then underwent cystoscopy with injection of ICG into bilateral ureters with Dr. Lonna Cobb.  Please see his operative note for further details.  The abdomen was prepped and draped in a sterile fashion. A supraumbilical incision was made. A cutdown technique was used to enter the abdominal cavity without injury, and a Hasson trocar was inserted.  Pneumoperitoneum was obtained with appropriate opening pressures.  Three 5-mm ports were placed in the right lower quadrant, left lower quadrant, and epigastric positions  under direct visualization.    We started with inspection of the abdominal cavity and there significant adhesions noted.  The omentum had adhered to the right lateral abdominal wall, covering the right colon.  There were also adhesions of the omentum to the liver edge and between liver and abdominal wall, as well as between loops of small bowel.  The liver appeared to have fatty infiltration changes.  We took down the adhesion of the omentum to the abdominal wall and liver first using LigaSure.  The terminal ileum was identified and we started taking down the adhesions between small bowel and abdominal wall.  The patient had a prior appendectomy and there were adhesions to the cecum.  We started mobilizing the right colon along the white line of Toldt.  It was difficult to find the appropriate plane due to the scarring and adhesions.  Midway through the right colon, we encountered very hard scarring from his diverticulitis abscess and drain.  We started instead mobilizing distally, starting in the mid transverse colon.  We mobilized the omentum off the transverse colon going proximally towards the hepatic flexure.  There again hard adhesions to the hepatic flexure corner.  Switching to the proximal dissection, we tried to look for the ureter using the ICG but the camera feature did not work.  At this point, it was decided to convert to open surgery due to inability to progress in a safe manner.  Pneumoperitoneum was released and the Hasson port was removed.  We used this entry point to make a midline incision going inferiorly and superiorly to the umbilicus incorporating the supraumbilical and the epigastric incisions.  Cautery was used to dissect through the subcutaneous tissue and through the fascia.  We were able to expose the right colon better and with combination of blunt and cautery dissection, get through the thick scar tissue from the colon to the abdominal wall.  This allowed [place] to better mobilize the  colon in a lateral to medial fashion.  In doing so, we were able to identify the right ureter.  Once mobilization was completed, windows were created in the mesentery of the terminal ileum and transverse colon, and blue load GIA stapler was used to transect both.  Then LigaSure was used to go through the mesentery of the colon.  Specimen was sent off to pathology.  We then proceeded with side to side ileocolonic anastomosis using the blue load GIA stapler to create and complete the common channel. The staple line was imbricated using 3-0 Silk sutures.  Then, the mesenteric defect was closed also with 3-0 Silk sutures.  The abdomen was then thoroughly irrigated with 3 L of warm saline.  Cautery was used for hemostasis, and 3 g of Arista powder was sprayed over the dissection field.  We then proceeded to rescrub and do our clean closure.  80 ml of Exparel combined with 0.25% bupivacaine with epi and saline was infiltrated over the peritoneum, fascia, and subcutaneous tissue.  The fascia was then closed using #1 PDS sutures.  The wound cavity was irrigated.  The subcutaneous space was approximated using 3-0 Vicryl sutures, and the midline and two remaining port incisions were closed with staples.  The wounds were cleaned and dressed with honeycomb dressings.  The patient was emerged from anesthesia and extubated and brought to the recovery room for further management.   The patient tolerated the procedure well and all counts were correct at the end of the case.    [name], MD

## 2020-08-06 NOTE — Transfer of Care (Signed)
Immediate Anesthesia Transfer of Care Note  Patient: Jacob Huerta  Procedure(s) Performed: LAPAROSCOPIC RIGHT COLECTOMY (Right ) CYSTOSCOPY ICG (N/A )  Patient Location: PACU  Anesthesia Type:General  Level of Consciousness: drowsy and patient cooperative  Airway & Oxygen Therapy: Patient Spontanous Breathing and Patient connected to face mask oxygen  Post-op Assessment: Report given to RN and Post -op Vital signs reviewed and stable  Post vital signs: Reviewed and stable  Last Vitals:  Vitals Value Taken Time  BP 115/59 08/06/20 1352  Temp 37.2 C 08/06/20 1345  Pulse 99 08/06/20 1353  Resp 26 08/06/20 1353  SpO2 88 % 08/06/20 1353  Vitals shown include unvalidated device data.  Last Pain:  Vitals:   08/06/20 1345  TempSrc:   PainSc: Asleep         Complications: No complications documented.

## 2020-08-06 NOTE — Op Note (Signed)
Preoperative diagnosis:  Right colon diverticulitis with abscess and fistula  Postoperative diagnosis:  Right colon diverticulitis with abscess and fistula  Procedure: Cystoscopy Bilateral ureteral catheterization with injection of ICG  Surgeon: Riki Altes, MD  Anesthesia: General  Complications: None  Intraoperative findings:  Cystoscopy-urethra normal in caliber without stricture; no significant prostate enlargement with mild bladder neck elevation; bladder gross without erythema, solid or papillary lesion.  Ureteral orifices normal-appearing bilaterally with clear efflux  EBL: 0 mL  Specimens: None  Indication: Jacob Huerta is a 29 y.o. male with with a history of diverticulitis with abscess scheduled for laparoscopic right colectomy.  ICG injection has been requested to aid in intraoperative ureteral localization. Informed consent has been obtained.  Description of procedure:  The patient was taken to the operating room and general anesthesia was induced.  The patient was placed in the dorsal lithotomy position, prepped and draped in the usual sterile fashion, and preoperative antibiotics were administered. A preoperative time-out was performed.   A 21 French cystoscope was lubricated and passed per urethra.  The scope was advanced proximally to the bladder with findings as described above.  A 5 French open-ended ureteral catheter was then placed into the left ureter and advanced to the 10 cm mark.  25 mg of ICG dissolved in 10 mL of saline was then injected as the catheter was withdrawn.  An identical procedure was performed on the contralateral side.  A 16 French Foley cath was placed to gravity drainage.  The patient was repositioned, prepped and draped for the procedure with Dr. Aleen Campi which will be dictated separately.   Riki Altes, M.D.

## 2020-08-07 ENCOUNTER — Encounter: Payer: Self-pay | Admitting: Surgery

## 2020-08-07 LAB — CBC WITH DIFFERENTIAL/PLATELET
Abs Immature Granulocytes: 0.07 10*3/uL (ref 0.00–0.07)
Basophils Absolute: 0 10*3/uL (ref 0.0–0.1)
Basophils Relative: 0 %
Eosinophils Absolute: 0 10*3/uL (ref 0.0–0.5)
Eosinophils Relative: 0 %
HCT: 41.9 % (ref 39.0–52.0)
Hemoglobin: 14.8 g/dL (ref 13.0–17.0)
Immature Granulocytes: 1 %
Lymphocytes Relative: 15 %
Lymphs Abs: 1.5 10*3/uL (ref 0.7–4.0)
MCH: 35.5 pg — ABNORMAL HIGH (ref 26.0–34.0)
MCHC: 35.3 g/dL (ref 30.0–36.0)
MCV: 100.5 fL — ABNORMAL HIGH (ref 80.0–100.0)
Monocytes Absolute: 0.9 10*3/uL (ref 0.1–1.0)
Monocytes Relative: 9 %
Neutro Abs: 7.7 10*3/uL (ref 1.7–7.7)
Neutrophils Relative %: 75 %
Platelets: 137 10*3/uL — ABNORMAL LOW (ref 150–400)
RBC: 4.17 MIL/uL — ABNORMAL LOW (ref 4.22–5.81)
RDW: 11.9 % (ref 11.5–15.5)
WBC: 10.2 10*3/uL (ref 4.0–10.5)
nRBC: 0 % (ref 0.0–0.2)

## 2020-08-07 LAB — BASIC METABOLIC PANEL
Anion gap: 10 (ref 5–15)
BUN: 13 mg/dL (ref 6–20)
CO2: 23 mmol/L (ref 22–32)
Calcium: 8.6 mg/dL — ABNORMAL LOW (ref 8.9–10.3)
Chloride: 103 mmol/L (ref 98–111)
Creatinine, Ser: 0.91 mg/dL (ref 0.61–1.24)
GFR, Estimated: >60 mL/min (ref >60)
Glucose, Bld: 107 mg/dL — ABNORMAL HIGH (ref 70–99)
Potassium: 3.8 mmol/L (ref 3.5–5.1)
Sodium: 136 mmol/L (ref 135–145)

## 2020-08-07 LAB — MAGNESIUM: Magnesium: 1.9 mg/dL (ref 1.7–2.4)

## 2020-08-07 MED ORDER — NICOTINE 21 MG/24HR TD PT24: 21.0000 mg | MEDICATED_PATCH | Freq: Every day | TRANSDERMAL | Status: DC

## 2020-08-07 MED ORDER — PANTOPRAZOLE SODIUM 40 MG PO TBEC
40.0000 mg | DELAYED_RELEASE_TABLET | Freq: Every day | ORAL | Status: DC
  Administered 2020-08-07 – 2020-08-08 (×2): 40 mg via ORAL
  Filled 2020-08-07 (×2): qty 1

## 2020-08-07 MED ORDER — CHLORHEXIDINE GLUCONATE CLOTH 2 % EX PADS
6.0000 | MEDICATED_PAD | Freq: Every day | CUTANEOUS | Status: DC
  Administered 2020-08-07: 6 via TOPICAL

## 2020-08-07 NOTE — Progress Notes (Signed)
[facility] SURGICAL PROGRESS NOTE  Hospital Day(s): 1.   Post op day(s): 1 Day Post-Op.   Interval History:  Patient seen and examined no acute events or new complaints overnight.  Patient reports he is overall doing well, incisional soreness, 7/10, pain medications are helping No fever, chills, nausea, emesis No leukocytosis this morning, WBC normal at 10.2K Renal function remains normal, scr - 0.91, UO - 1.3L No electrolyte derangements  He has remained NPO post-operatively No reports of flatus to this point Has not mobilized post-op  Vital signs in last 24 hours: [min-max] current  Temp:  [97.8 F (36.6 C)-99.1 F (37.3 C)] 98.2 F (36.8 C) (11/10 0416) Pulse Rate:  [60-120] 60 (11/10 0416) Resp:  [15-25] 17 (11/10 0416) BP: (115-161)/(59-105) 136/81 (11/10 0416) SpO2:  [90 %-98 %] 95 % (11/10 0416)     Height: 5' 8.5" (174 cm) Weight: 108.9 kg BMI (Calculated): 35.97   Intake/Output last 2 shifts:  11/09 0701 - 11/10 0700 In: 2450 [P.O.:50; I.V.:2300; IV Piggyback:100] Out: 1750 [Urine:1300; Blood:150]   Physical Exam:  Constitutional: alert, cooperative and no distress  Respiratory: breathing non-labored at rest  Cardiovascular: regular rate and sinus rhythm  Gastrointestinal: Soft, incisional soreness, and non-distended, no rebound/guarding Integumentary: Laparotomy and laparoscopic incisions are CDI with staples, no erythema, some drainage on midline honeycomb   Labs:  CBC Latest Ref Rng & Units 08/07/2020 08/02/2020 01/09/2020  WBC 4.0 - 10.5 K/uL 10.2 7.3 6.5  Hemoglobin 13.0 - 17.0 g/dL 79.8 92.1 12.9(L)  Hematocrit 39 - 52 % 41.9 47.7 35.2(L)  Platelets 150 - 400 K/uL 137(L) 141(L) 225   CMP Latest Ref Rng & Units 08/07/2020 08/02/2020 01/08/2020  Glucose 70 - 99 mg/dL 194(R) 740(C) 87  BUN 6 - 20 mg/dL 13 5(L) 8  Creatinine 1.44 - 1.24 mg/dL 8.18 5.63 1.49  Sodium 135 - 145 mmol/L 136 145 138  Potassium 3.5 - 5.1 mmol/L 3.8 3.3(L) 3.5   Chloride 98 - 111 mmol/L 103 107 105  CO2 22 - 32 mmol/L 23 25 25   Calcium 8.9 - 10.3 mg/dL ) 7.0(Y) 8.3(L)  Total Protein 6.5 - 8.1 g/dL - 7.5 6.8  Total Bilirubin 0.3 - 1.2 mg/dL - 1.1 6.3(Z)  Alkaline Phos 38 - 126 U/L - 70 62  AST 15 - 41 U/L - 77(H) 37  ALT 0 - 44 U/L - 52(H) 30     Imaging studies: No new pertinent imaging studies   Assessment/Plan:  29 y.o. 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   - Okay for CLD this morning  - Continue IVF resuscitation for now; wean as diet advancing   - Monitor abdominal examination; on-going bowel function  - Pain control prn; antiemetics prn   - Discontinue foley catheter   - Continue entereg until bowel function returns  - Mobilization encouraged   - DVT Prophylaxs   All of the above findings and recommendations were discussed with the patient, and the medical team, and all of patient's questions were answered to his expressed satisfaction.  -- 37, PA-C [facility] 08/07/2020, 7:29 AM [phone] M-F: 7am - 4pm

## 2020-08-07 NOTE — Progress Notes (Signed)
PHARMACIST - PHYSICIAN COMMUNICATION  DR:   Aleen Campi  CONCERNING: IV to Oral Route Change Policy  RECOMMENDATION: This patient is receiving pantoprazole by the intravenous route.  Based on criteria approved by the Pharmacy and Therapeutics Committee, the intravenous medication(s) is/are being converted to the equivalent oral dose form(s).   DESCRIPTION: These criteria include:  The patient is eating (either orally or via tube) and/or has been taking other orally administered medications for a least 24 hours  The patient has no evidence of active gastrointestinal bleeding or impaired GI absorption (gastrectomy, short bowel, patient on TNA or NPO).  If you have questions about this conversion, please contact the Pharmacy Department  []   [phone] )  ( [phone] [[phone]]   [phone] )  [facility] []   [phone] )  [facility] []   [phone] )  [facility] []   [phone] )  [facility]   ( [phone], [facility] 08/07/2020 1:19 PM

## 2020-08-08 LAB — SURGICAL PATHOLOGY

## 2020-08-08 MED ORDER — NICOTINE 21 MG/24HR TD PT24
21.0000 mg | MEDICATED_PATCH | Freq: Every day | TRANSDERMAL | Status: DC
  Administered 2020-08-08 – 2020-08-09 (×2): 21 mg via TRANSDERMAL
  Filled 2020-08-08 (×2): qty 1

## 2020-08-08 MED ORDER — CALCIUM CARBONATE ANTACID 500 MG PO CHEW: 1.0000 | CHEWABLE_TABLET | Freq: Two times a day (BID) | ORAL | Status: DC | PRN

## 2020-08-08 NOTE — Progress Notes (Signed)
Mobility Specialist - Progress Note   08/08/20 1500  Mobility  Activity Ambulated in hall  Level of Assistance Independent  Assistive Device Front wheel walker  Distance Ambulated (ft) 220 ft  Mobility Response Tolerated well  Mobility performed by Mobility specialist  $Mobility charge 1 Mobility    Pre-mobility: 83 HR, 96% SpO2 Post-mobility: 81 HR, 96% SpO2   Pt was lying in bed upon arrival with spouse present in room. Pt agreed to session. Pt denied any nausea or fatigue, but did c/o pain in abdomen "4/10". Per discussion with nurse, pain medication was taken prior to session. Pt was independent in all transfers this date, including ambulation. Pt ambulated 220' in room/hallway with no LOB noted. Pt denied dizziness, but did c/o "feeling a little fuzzy". Pt denied SOB or weakness during ambulation. Overall, pt tolerated session well. Pt was left in bed with all needs in reach.    Filiberto Pinks Mobility Specialist 08/08/20, 3:08 PM

## 2020-08-08 NOTE — Progress Notes (Signed)
[facility] SURGICAL PROGRESS NOTE  Hospital Day(s): 2.   Post op day(s): 2 Days Post-Op.   Interval History:  Patient seen and examined No acute events or new complaints overnight.  Patient reports abdominal soreness but his pain regimen is effective No fever, chills, nausea, emesis  No new labs or imaging this morning Good UO - 520 ccs + unmeasured He had tolerated CLD yesterday Return of bowel function with flatus and BMs Mobilizing without major concern  Vital signs in last 24 hours: [min-max] current  Temp:  [97.7 F (36.5 C)-98.1 F (36.7 C)] 98.1 F (36.7 C) (11/11 0343) Pulse Rate:  [60-95] 64 (11/11 0343) Resp:  [16-17] 17 (11/11 0343) BP: (118-144)/(66-97) 130/82 (11/11 0343) SpO2:  [91 %-96 %] 94 % (11/11 0343)     Height: 5' 8.5" (174 cm) Weight: 108.9 kg BMI (Calculated): 35.97   Intake/Output last 2 shifts:  11/10 0701 - 11/11 0700 In: 1080 [P.O.:1080] Out: 520 [Urine:520]   Physical Exam:  Constitutional: alert, cooperative and no distress  Respiratory: breathing non-labored at rest  Cardiovascular: regular rate and sinus rhythm  Gastrointestinal: Soft, incisional soreness, and non-distended, no rebound/guarding Integumentary: Laparotomy and laparoscopic incisions are CDI with staples, no erythema, some drainage on midline honeycomb   Labs:  CBC Latest Ref Rng & Units 08/07/2020 08/02/2020 01/09/2020  WBC 4.0 - 10.5 K/uL 10.2 7.3 6.5  Hemoglobin 13.0 - 17.0 g/dL 32.9 51.8 12.9(L)  Hematocrit 39 - 52 % 41.9 47.7 35.2(L)  Platelets 150 - 400 K/uL 137(L) 141(L) 225   CMP Latest Ref Rng & Units 08/07/2020 08/02/2020 01/08/2020  Glucose 70 - 99 mg/dL 841(Y) 606(T) 87  BUN 6 - 20 mg/dL 13 5(L) 8  Creatinine 0.16 - 1.24 mg/dL 0.10 9.32 3.55  Sodium 135 - 145 mmol/L 136 145 138  Potassium 3.5 - 5.1 mmol/L 3.8 3.3(L) 3.5  Chloride 98 - 111 mmol/L 103 107 105  CO2 22 - 32 mmol/L 23 25 25   Calcium 8.9 - 10.3 mg/dL ) 7.3(U) 8.3(L)  Total  Protein 6.5 - 8.1 g/dL - 7.5 6.8  Total Bilirubin 0.3 - 1.2 mg/dL - 1.1 2.0(U)  Alkaline Phos 38 - 126 U/L - 70 62  AST 15 - 41 U/L - 77(H) 37  ALT 0 - 44 U/L - 52(H) 30     Imaging studies: No new pertinent imaging studies   Assessment/Plan:  29 y.o. 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   - Will advance to full liquid diet; possibly soft diet for dinner  - Discontinue IVF   - Monitor abdominal examination; on-going bowel function             - Pain control prn; antiemetics prn              - Discontinue Entereg  - Added TUMS             - Mobilization encouraged              - DVT Prophylaxs     - Discharge Planning: If tolerates advancement of diet today then anticipate discharge tomorrow   All of the above findings and recommendations were discussed with the patient, and the medical team, and all of patient's questions were answered to his expressed satisfaction.  -- 37, PA-C [facility] 08/08/2020, 7:37 AM [phone] M-F: 7am - 4pm

## 2020-08-09 MED ORDER — IBUPROFEN 800 MG PO TABS: 800.0000 mg | ORAL_TABLET | Freq: Three times a day (TID) | ORAL | 0 refills | Status: AC | PRN

## 2020-08-09 MED ORDER — OXYCODONE HCL 5 MG PO TABS: 5.0000 mg | ORAL_TABLET | ORAL | 0 refills | Status: DC | PRN

## 2020-08-09 NOTE — Progress Notes (Signed)
Jacob Huerta  A and O x 4 VSS. Pt tolerating diet well. No complaints of pain or nausea. IV removed intact, prescriptions given. Pt voices understanding of discharge instructions with no further questions. Pt discharged home via wheelchair with dad.   Allergies as of 08/09/2020      Reactions   Uncaria Tomentosa (cats Claw) Itching   Hydrocodone Itching, Nausea And Vomiting      Medication List    STOP taking these medications   bisacodyl 5 MG EC tablet Commonly known as: DULCOLAX   metroNIDAZOLE 500 MG tablet Commonly known as: FLAGYL   neomycin 500 MG tablet Commonly known as: MYCIFRADIN   polyethylene glycol powder 17 GM/SCOOP powder Commonly known as: MiraLax     TAKE these medications   albuterol 108 (90 Base) MCG/ACT inhaler Commonly known as: VENTOLIN HFA Inhale 2 puffs into the lungs every 6 (six) hours as needed for wheezing or shortness of breath.   ibuprofen 800 MG tablet Commonly known as: ADVIL Take 1 tablet (800 mg total) by mouth every 8 (eight) hours as needed.   multivitamin with minerals Tabs tablet Take 1 tablet by mouth daily.   omeprazole 40 MG capsule Commonly known as: PRILOSEC Take 40 mg by mouth in the morning and at bedtime.   oxyCODONE 5 MG immediate release tablet Commonly known as: Oxy IR/ROXICODONE Take 1 tablet (5 mg total) by mouth every 4 (four) hours as needed for severe pain or breakthrough pain.       Vitals:   08/09/20 0541 08/09/20 0842  BP: (!) 142/79 139/84  Pulse: 75 69  Resp: 18 18  Temp: 97.8 F (36.6 C) 97.9 F (36.6 C)  SpO2: 95% 96%    Jacob Huerta Jacob Huerta

## 2020-08-09 NOTE — Discharge Instructions (Signed)
In addition to included general post-operative instructions for right colectomy,  Diet: Resume home diet.   Activity: No heavy lifting >20 pounds (children, pets, laundry, garbage) for 4-6 weeks, but light activity and walking are encouraged. Do not drive or drink alcohol if taking narcotic pain medications or having pain that might distract from driving.  Wound care: You may shower/get incision wet with soapy water and pat dry (do not rub incisions), but no baths or submerging incision underwater until follow-up.   Medications: Resume all home medications. For mild to moderate pain: acetaminophen (Tylenol) or ibuprofen/naproxen (if no kidney disease). Combining Tylenol with alcohol can substantially increase your risk of causing liver disease. Narcotic pain medications, if prescribed, can be used for severe pain, though may cause nausea, constipation, and drowsiness. Do not combine Tylenol and Percocet (or similar) within a 6 hour period as Percocet (and similar) contain(s) Tylenol. If you do not need the narcotic pain medication, you do not need to fill the prescription.  Call office [phone] / [phone]) at any time if any questions, worsening pain, fevers/chills, bleeding, drainage from incision site, or other concerns.

## 2020-08-09 NOTE — Discharge Summary (Signed)
[facility] SURGICAL DISCHARGE SUMMARY  Patient ID: Jacob Huerta MRN: [id number] DOB/AGE: June 18, 1991 29 y.o.  Admit date: 08/06/2020 Discharge date: 08/09/2020  Discharge Diagnoses Patient Active Problem List   Diagnosis Date Noted  . S/P right colectomy 08/06/2020  . Abnormal liver enzymes   . Diverticulitis of large intestine with abscess without bleeding 01/04/2020  . Acute appendicitis 09/26/2019    Consultants Urology - From intra-op ICG  Procedures 08/06/2020:  Laparoscopic Converted to Open Right Colectomy  HPI: Jacob Huerta is a 29 y.o. 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  The benefits, complications, treatment options, and expected outcomes were discussed with the patient. The risks of bleeding, infection, bowel injury, and need for further procedures were all discussed with the patient and he was willing to proceed.  Hospital Course: Informed consent was obtained and documented, and patient underwent uneventful Laparoscopic Converted to Open Right Colectomy (Dr Aleen Campi, 08/06/2020).  Post-operatively, patient did well. Advancement of patient's diet and ambulation were well-tolerated. The remainder of patient's hospital course was essentially unremarkable, and discharge planning was initiated accordingly with patient safely able to be discharged home with appropriate discharge instructions, pain control, and outpatient follow-up after all of his questions were answered tohis expressed satisfaction.   Discharge Condition: Good   Physical Examination:  Constitutional: alert, cooperative and no distress  Respiratory: breathing non-labored at rest  Cardiovascular: regular rate and sinus rhythm  Gastrointestinal:Soft,incisional soreness, and non-distended, no rebound/guarding Integumentary:Laparotomy and  laparoscopic incisions are CDI with staples, no erythem   Allergies as of 08/09/2020      Reactions   Uncaria Tomentosa (cats Claw) Itching   Hydrocodone Itching, Nausea And Vomiting      Medication List    STOP taking these medications   bisacodyl 5 MG EC tablet Commonly known as: DULCOLAX   metroNIDAZOLE 500 MG tablet Commonly known as: FLAGYL   neomycin 500 MG tablet Commonly known as: MYCIFRADIN   polyethylene glycol powder 17 GM/SCOOP powder Commonly known as: MiraLax     TAKE these medications   albuterol 108 (90 Base) MCG/ACT inhaler Commonly known as: VENTOLIN HFA Inhale 2 puffs into the lungs every 6 (six) hours as needed for wheezing or shortness of breath.   ibuprofen 800 MG tablet Commonly known as: ADVIL Take 1 tablet (800 mg total) by mouth every 8 (eight) hours as needed.   multivitamin with minerals Tabs tablet Take 1 tablet by mouth daily.   omeprazole 40 MG capsule Commonly known as: PRILOSEC Take 40 mg by mouth in the morning and at bedtime.   oxyCODONE 5 MG immediate release tablet Commonly known as: Oxy IR/ROXICODONE Take 1 tablet (5 mg total) by mouth every 4 (four) hours as needed for severe pain or breakthrough pain.         Follow-up Information    Henrene Dodge, MD. Schedule an appointment as soon as possible for a visit on 08/16/2020.   Specialty: General Surgery Why: s/p right colectomy, has staples Contact information: [address] [place] [address] [phone]                Time spent on discharge management including discussion of hospital course, clinical condition, outpatient instructions, prescriptions, and follow up with the patient and members of the medical team: >30 minutes  -- Lynden Oxford , PA-C [facility]  08/09/2020, 1:20 PM  [phone] M-F: 7am - 4pm

## 2020-08-16 ENCOUNTER — Ambulatory Visit (INDEPENDENT_AMBULATORY_CARE_PROVIDER_SITE_OTHER): Payer: Self-pay | Admitting: Surgery

## 2020-08-16 ENCOUNTER — Other Ambulatory Visit: Payer: Self-pay

## 2020-08-16 ENCOUNTER — Encounter: Payer: Self-pay | Admitting: Surgery

## 2020-08-16 VITALS — BP 121/77 | HR 83 | Temp 97.9°F | Ht 64.5 in | Wt 250.4 lb

## 2020-08-16 DIAGNOSIS — K5720 Diverticulitis of large intestine with perforation and abscess without bleeding: Secondary | ICD-10-CM

## 2020-08-16 DIAGNOSIS — Z09 Encounter for follow-up examination after completed treatment for conditions other than malignant neoplasm: Secondary | ICD-10-CM

## 2020-08-16 NOTE — Patient Instructions (Addendum)
We will have you follow up in 2 weeks.   May rub Vitamin-E oil or other emmolient agent in area 2-3 times a day to soften. You will need to use sunscreen for the next year on the area to minimize altered pigmentation of the site.   GENERAL POST-OPERATIVE PATIENT INSTRUCTIONS   WOUND CARE INSTRUCTIONS:  Keep a dry clean dressing on the wound if there is drainage. The initial bandage may be removed after 24 hours.  Once the wound has quit draining you may leave it open to air.  If clothing rubs against the wound or causes irritation and the wound is not draining you may cover it with a dry dressing during the daytime.  Try to keep the wound dry and avoid ointments on the wound unless directed to do so.  If the wound becomes bright red and painful or starts to drain infected material that is not clear, please contact your physician immediately.  If the wound is mildly pink and has a thick firm ridge underneath it, this is normal, and is referred to as a healing ridge.  This will resolve over the next 4-6 weeks.  BATHING: You may shower if you have been informed of this by your surgeon. However, Please do not submerge in a tub, hot tub, or pool until incisions are completely sealed or have been told by your surgeon that you may do so.  DIET:  You may eat any foods that you can tolerate.  It is a good idea to eat a high fiber diet and take in plenty of fluids to prevent constipation.  If you do become constipated you may want to take a mild laxative or take ducolax tablets on a daily basis until your bowel habits are regular.  Constipation can be very uncomfortable, along with straining, after recent surgery.  ACTIVITY:  You are encouraged to cough and deep breath or use your incentive spirometer if you were given one, every 15-30 minutes when awake.  This will help prevent respiratory complications and low grade fevers post-operatively if you had a general anesthetic.  You may want to hug a pillow when  coughing and sneezing to add additional support to the surgical area, if you had abdominal or chest surgery, which will decrease pain during these times.  You are encouraged to walk and engage in light activity for the next two weeks.  You should not lift more than 20 pounds for 4 weeks as it could put you at increased risk for complications.  Twenty pounds is roughly equivalent to a plastic bag of groceries. At that time- Listen to your body when lifting, if you have pain when lifting, stop and then try again in a few days. Soreness after doing exercises or activities of daily living is normal as you get back in to your normal routine.  MEDICATIONS:  Try to take narcotic medications and anti-inflammatory medications, such as tylenol, ibuprofen, naprosyn, etc., with food.  This will minimize stomach upset from the medication.  Should you develop nausea and vomiting from the pain medication, or develop a rash, please discontinue the medication and contact your physician.  You should not drive, make important decisions, or operate machinery when taking narcotic pain medication.  SUNBLOCK Use sun block to incision area over the next year if this area will be exposed to sun. This helps decrease scarring and will allow you avoid a permanent darkened area over your incision.  QUESTIONS:  Please feel free to call our  office if you have any questions, and we will be glad to assist you.

## 2020-08-16 NOTE — Progress Notes (Signed)
08/16/2020  HPI: Jacob Huerta is a 29 y.o. male s/p laparoscopic converted to open right colectomy for diverticulitis with abscess on 08/06/20.  He was discharged home on 11/12.  Today, patient reports having only some soreness but no worsening pain.  Denies any nausea or vomiting and is having regular bowel function.  Reports that he no longer has the RUQ pain and nausea that he would have preop.    Vital signs: BP 121/77   Pulse 83   Temp 97.9 F (36.6 C) (Oral)   Ht 5' 4.5" (1.638 m)   Wt 250 lb 6.4 oz (113.6 kg)   SpO2 97%   BMI 42.32 kg/m    Physical Exam: Constitutional:  No acute distress Abdomen:  Soft, non-distended, appropriately sore to palpation.  Incisions are clean, dry, intact, with staples in place.  Staples removed and replaced with steri strips.  Assessment/Plan: This is a 29 y.o. male s/p laparoscopic converted to open right colectomy.  --Patient is doing well, healing appropriately.  Staples removed and changed to steri strips.  Instructions given about care for steri strips. --Reminded him of activity restrictions for total of 4 weeks. --Follow up in 3 weeks.   [name], MD [facility]

## 2020-08-19 ENCOUNTER — Telehealth: Payer: Self-pay | Admitting: Surgery

## 2020-08-19 NOTE — Telephone Encounter (Signed)
Patient is calling and has some concern, said he had some staples removed on Friday and now has had some blisters to show up around the same area. Please call patient and advise.

## 2020-08-19 NOTE — Telephone Encounter (Signed)
Spoke with patient. He states he had staples removed on Friday and he has noticed some small fluid filled blisters where the glue was applied. He stated the steri strips  are not sticking to the skin. He was instructed to allow soapy water run over the area and if the steri strips fell off not to worry. Do not apply any creams or ointments. Denies fever, good bowel movements, only taking ibuprofen for discomfort when needed.  Stated he was doing well otherwise. We discussed possibly a reaction to the benzoin or mastisol that was used to help the steri strips adhere to the skin.Patient verbalized understanding.

## 2020-09-02 ENCOUNTER — Other Ambulatory Visit: Payer: Self-pay

## 2020-09-02 ENCOUNTER — Ambulatory Visit (INDEPENDENT_AMBULATORY_CARE_PROVIDER_SITE_OTHER): Payer: Self-pay | Admitting: Surgery

## 2020-09-02 ENCOUNTER — Encounter: Payer: Self-pay | Admitting: Surgery

## 2020-09-02 VITALS — BP 142/88 | HR 80 | Temp 98.2°F | Ht 68.5 in | Wt 245.6 lb

## 2020-09-02 DIAGNOSIS — K5720 Diverticulitis of large intestine with perforation and abscess without bleeding: Secondary | ICD-10-CM

## 2020-09-02 DIAGNOSIS — Z09 Encounter for follow-up examination after completed treatment for conditions other than malignant neoplasm: Secondary | ICD-10-CM

## 2020-09-02 NOTE — Progress Notes (Signed)
09/02/2020  HPI: Jacob Huerta is a 29 y.o. male s/p laparoscopic converted to open right colectomy on 08/06/20.  He presents for follow up.  Patient reports that he's doing well.  Reports some soreness in right side depending on how he moves or stretches, but denies any pain, nausea, vomiting.  Is eating well and having good bowel function.  His staples were removed on 11/19 and steri strips placed, and he developed some skin blistering at the site of a few steri strips.  No further issues.  Vital signs: BP (!) 142/88   Pulse 80   Temp 98.2 F (36.8 C) (Oral)   Ht 5' 8.5" (1.74 m)   Wt 245 lb 9.6 oz (111.4 kg)   SpO2 97%   BMI 36.80 kg/m    Physical Exam: Constitutional:  No acute distress Abdomen:  Soft, non-distended, non-tender to palpation.  Midline incision is healing well.  There are two small areas of scab but no evidence of any wound breakdown or infection.  Blistered areas have fully healed.  Assessment/Plan: This is a 29 y.o. male s/p laparoscopic converted to open right colectomy.  --Discussed with the patient that he's healing well, without any complications.  The incision is doing well, and there's no evidence of hernia.   --Tomorrow will mark 4 weeks out from surgery.  He can start ramping up activity level and at 6 weeks will have no further restrictions.  He can go back to work. --Follow up as needed.   Jacob Ill, MD [facility]

## 2020-09-02 NOTE — Patient Instructions (Signed)
Please call our office if you have any questions or concerns.   GENERAL POST-OPERATIVE PATIENT INSTRUCTIONS   WOUND CARE INSTRUCTIONS:  Keep a dry clean dressing on the wound if there is drainage. The initial bandage may be removed after 24 hours.  Once the wound has quit draining you may leave it open to air.  If clothing rubs against the wound or causes irritation and the wound is not draining you may cover it with a dry dressing during the daytime.  Try to keep the wound dry and avoid ointments on the wound unless directed to do so.  If the wound becomes bright red and painful or starts to drain infected material that is not clear, please contact your physician immediately.  If the wound is mildly pink and has a thick firm ridge underneath it, this is normal, and is referred to as a healing ridge.  This will resolve over the next 4-6 weeks.  BATHING: You may shower if you have been informed of this by your surgeon. However, Please do not submerge in a tub, hot tub, or pool until incisions are completely sealed or have been told by your surgeon that you may do so.  DIET:  You may eat any foods that you can tolerate.  It is a good idea to eat a high fiber diet and take in plenty of fluids to prevent constipation.  If you do become constipated you may want to take a mild laxative or take ducolax tablets on a daily basis until your bowel habits are regular.  Constipation can be very uncomfortable, along with straining, after recent surgery.  ACTIVITY:  You are encouraged to cough and deep breath or use your incentive spirometer if you were given one, every 15-30 minutes when awake.  This will help prevent respiratory complications and low grade fevers post-operatively if you had a general anesthetic.  You may want to hug a pillow when coughing and sneezing to add additional support to the surgical area, if you had abdominal or chest surgery, which will decrease pain during these times.  You are encouraged  to walk and engage in light activity for the next two weeks.  You should not lift more than 20 pounds, until 09/17/20 as it could put you at increased risk for complications.  Twenty pounds is roughly equivalent to a plastic bag of groceries. At that time- Listen to your body when lifting, if you have pain when lifting, stop and then try again in a few days. Soreness after doing exercises or activities of daily living is normal as you get back in to your normal routine.  MEDICATIONS:  Try to take narcotic medications and anti-inflammatory medications, such as tylenol, ibuprofen, naprosyn, etc., with food.  This will minimize stomach upset from the medication.  Should you develop nausea and vomiting from the pain medication, or develop a rash, please discontinue the medication and contact your physician.  You should not drive, make important decisions, or operate machinery when taking narcotic pain medication.  SUNBLOCK Use sun block to incision area over the next year if this area will be exposed to sun. This helps decrease scarring and will allow you avoid a permanent darkened area over your incision.  QUESTIONS:  Please feel free to call our office if you have any questions, and we will be glad to assist you. [phone]

## 2020-09-18 IMAGING — CT CT ABD-PELV W/ CM
1 of 2 series · 15 of 32 positions shown, 19 images · IV contrast (APPLIED)
Comparison: February 29, 2020.

CLINICAL DATA: Right-sided abdominal pain.

EXAM:
CT ABDOMEN AND PELVIS WITH CONTRAST
TECHNIQUE: Multidetector CT imaging of the abdomen and pelvis was performed
using the standard protocol following bolus administration of
intravenous contrast.
CONTRAST:  125mL OMNIPAQUE IOHEXOL 300 MG/ML  SOLN

[Series 2: axial st · axial · 0.88mm/px · z∈[-1051,-561]mm · 15 of 108 slices shown, 19 images]
[im 5/108  soft-tissue]
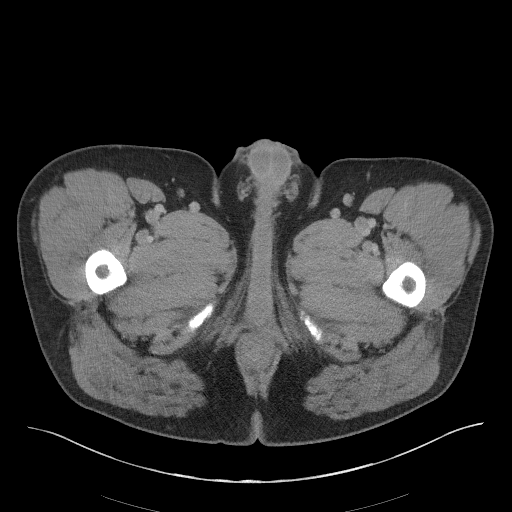
[im 5/108  bone]
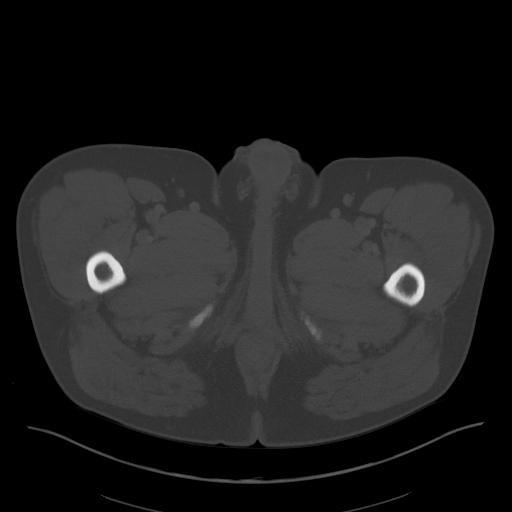
[im 14/108  soft-tissue]
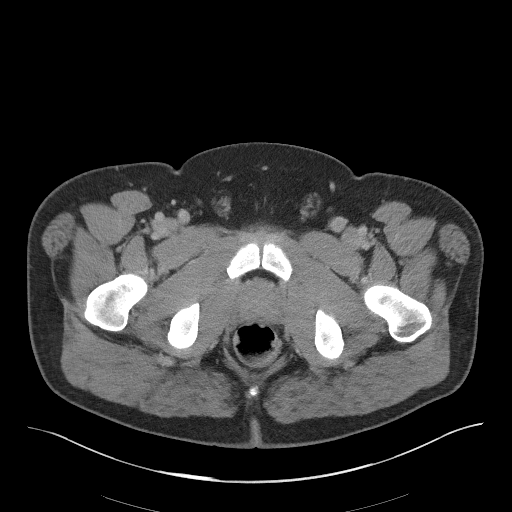
[im 23/108  soft-tissue]
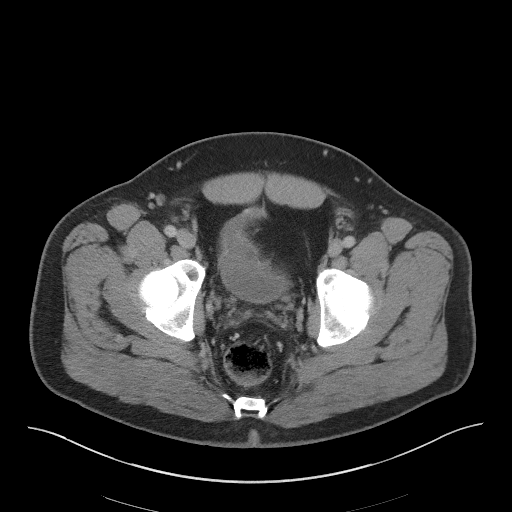
[im 32/108  soft-tissue]
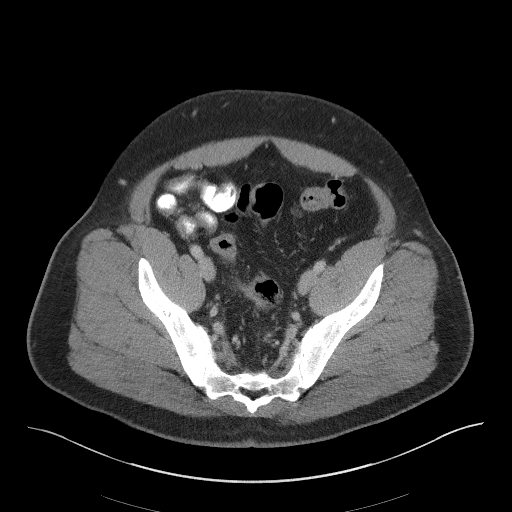
[im 36/108  soft-tissue]
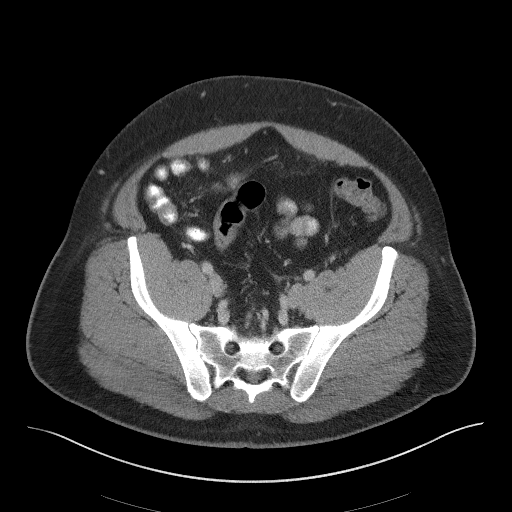
[im 45/108  soft-tissue]
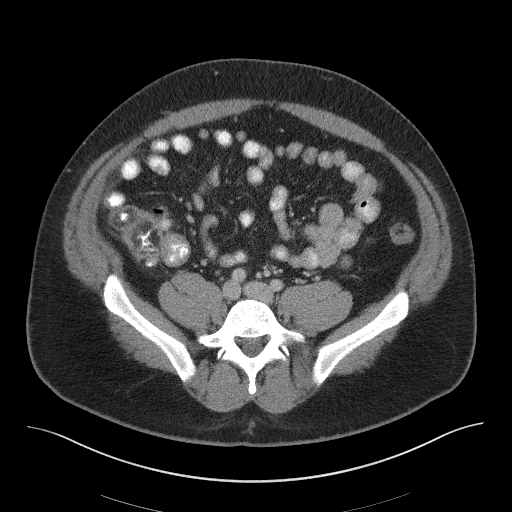
[im 54/108  soft-tissue]
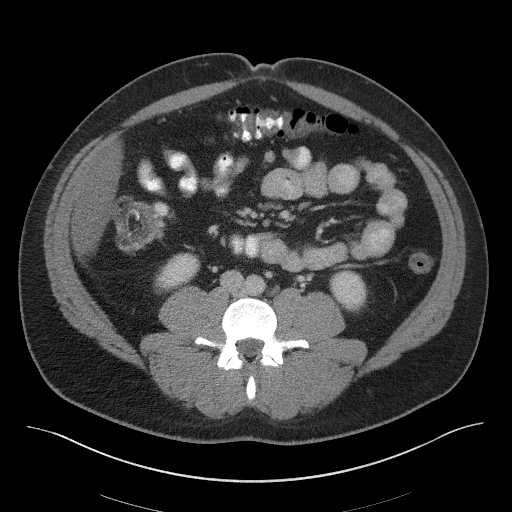
[im 63/108  soft-tissue]
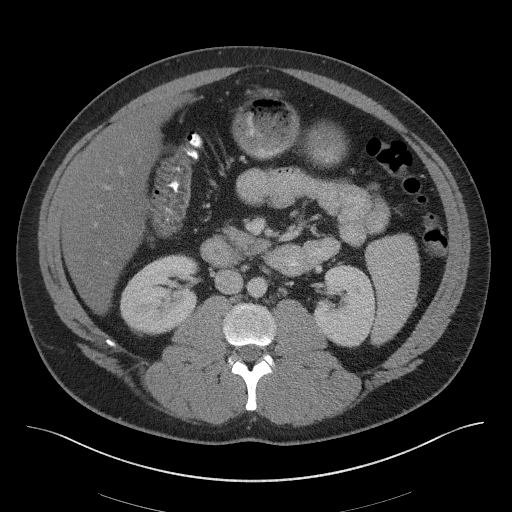
[im 72/108  soft-tissue]
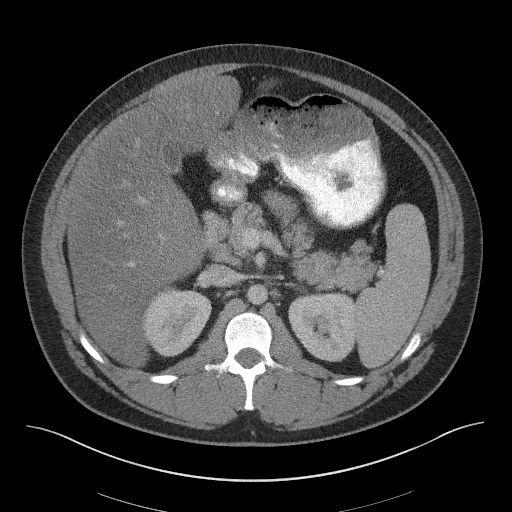
[im 72/108  bone]
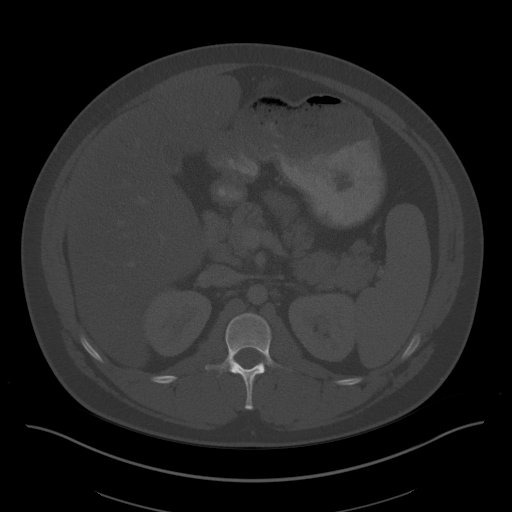
[im 76/108  soft-tissue]
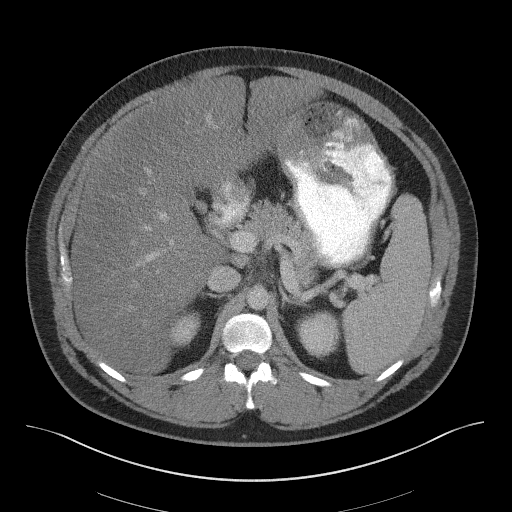
[im 85/108  soft-tissue]
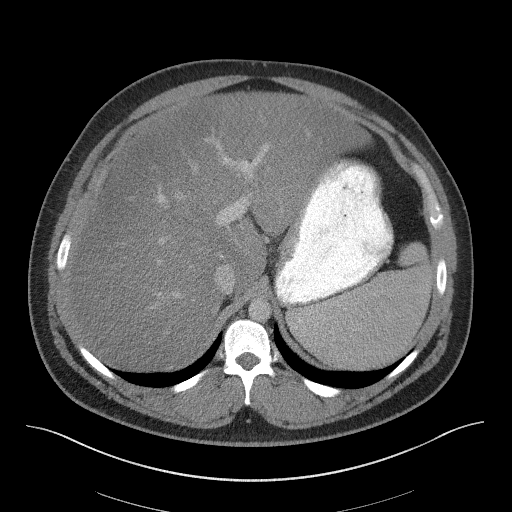
[im 90/108  lung]
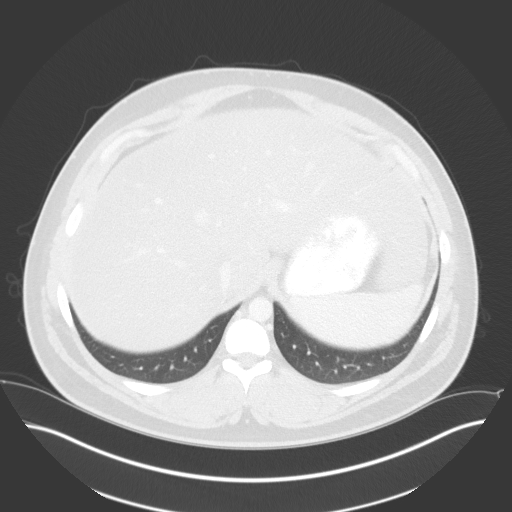
[im 94/108  soft-tissue]
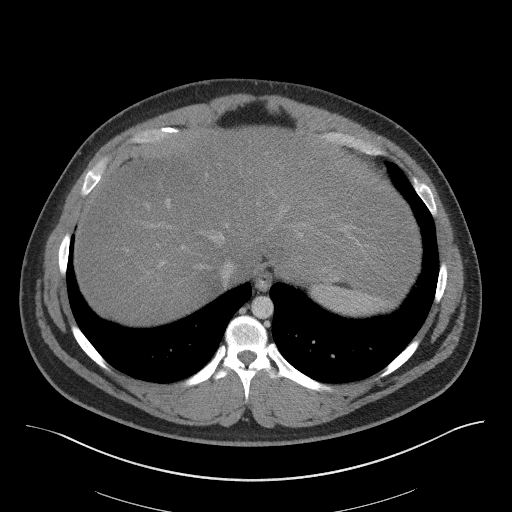
[im 94/108  lung]
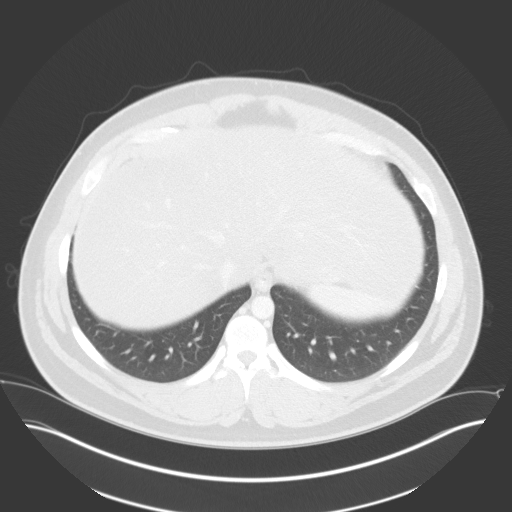
[im 99/108  lung]
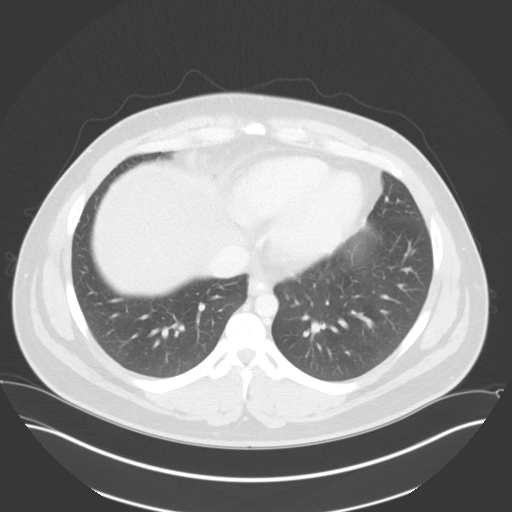
[im 103/108  soft-tissue]
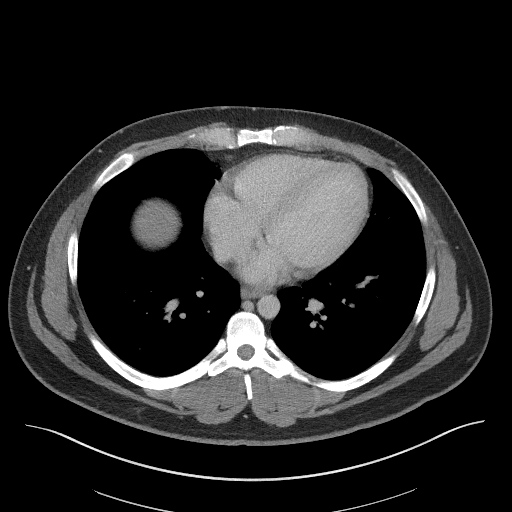
[im 103/108  lung]
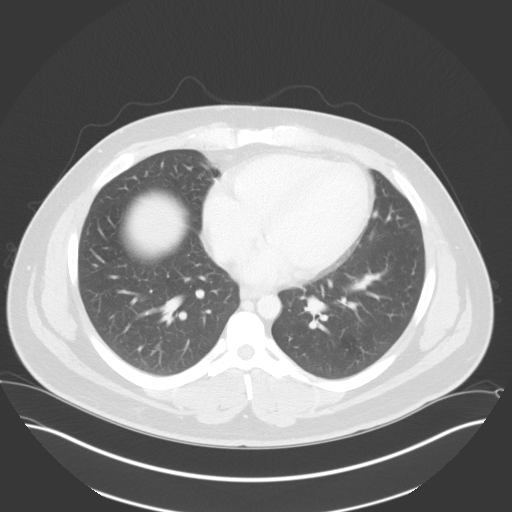

[15 of 32 positions shown; findings below may reference images not displayed]

FINDINGS: Lower chest: No acute abnormality.

Hepatobiliary: Hepatic steatosis is noted. No gallstones or biliary
dilatation is noted.

Pancreas: Unremarkable. No pancreatic ductal dilatation or
surrounding inflammatory changes.

Spleen: Normal in size without focal abnormality.

Adrenals/Urinary Tract: Adrenal glands are unremarkable. Kidneys are
normal, without renal calculi, focal lesion, or hydronephrosis.
Bladder is unremarkable.

Stomach/Bowel: Stomach appears normal. There is no evidence of bowel
obstruction or inflammation. The patient appears to be status post
appendectomy.

Vascular/Lymphatic: No significant vascular findings are present. No
enlarged abdominal or pelvic lymph nodes.

Reproductive: Prostate is unremarkable.

Other: No abdominal wall hernia or abnormality. No abdominopelvic
ascites.

Musculoskeletal: No acute or significant osseous findings.
IMPRESSION: Hepatic steatosis. No other abnormality seen in the abdomen or
pelvis.

## 2024-06-16 ENCOUNTER — Ambulatory Visit: Payer: Self-pay

## 2024-06-16 NOTE — Telephone Encounter (Signed)
 FYI Only or Action Required?: FYI only for provider.  Patient was last seen in primary care on na.  Called Nurse Triage reporting Arm Injury.  Symptoms began a week ago.  Interventions attempted: Nothing.  Symptoms are: unchanged.  Triage Disposition: See PCP When Office is Open (Within 3 Days)  Patient/caregiver understands and will follow disposition?: Yes    Copied from CRM [id number]. Topic: Clinical - Red Word Triage >> Jun 16, 2024  1:13 PM Turkey B wrote: [facility] that prompted transfer to Nurse Triage: Patient fell on arm last week, his severe pain left arm Reason for Disposition  [1] After 3 days AND [2] pain not improved  Answer Assessment - Initial Assessment Questions 1. MECHANISM: How did the injury happen?     Was changing a light under the house and fell on arm 2. ONSET: When did the injury happen? (e.g., minutes, hours ago)      Last week 3. LOCATION: Where is the injury located? Which arm?     Left arm 4. APPEARANCE of INJURY: What does the injury look like?      Normal but can't lift arm above head 5. SEVERITY: Can you use the arm normally?      No see above 6. SWELLING or BRUISING: is there any swelling or bruising? If Yes, ask: How large is it? (e.g., inches, centimeters)      no 7. PAIN: Is there pain? If Yes, ask: How bad is the pain? (Scale 0-10; or none, mild, moderate, severe)     Mild but severe when he tries to move above head 8. TETANUS: For any breaks in the skin, ask: When was your last tetanus booster?     na 9. OTHER SYMPTOMS: Do you have any other symptoms?  (e.g., numbness in hand)     numbness 10. PREGNANCY: Is there any chance you are pregnant? When was your last menstrual period?       na  Protocols used: Arm Injury-A-AH

## 2024-06-19 ENCOUNTER — Encounter (HOSPITAL_BASED_OUTPATIENT_CLINIC_OR_DEPARTMENT_OTHER): Payer: Self-pay | Admitting: Family Medicine

## 2024-06-19 NOTE — Telephone Encounter (Signed)
 noted

## 2024-06-28 ENCOUNTER — Encounter (HOSPITAL_BASED_OUTPATIENT_CLINIC_OR_DEPARTMENT_OTHER): Payer: Self-pay | Admitting: Family Medicine

## 2024-06-29 ENCOUNTER — Encounter (HOSPITAL_BASED_OUTPATIENT_CLINIC_OR_DEPARTMENT_OTHER): Payer: Self-pay | Admitting: *Deleted

## 2024-06-29 ENCOUNTER — Encounter (HOSPITAL_BASED_OUTPATIENT_CLINIC_OR_DEPARTMENT_OTHER): Payer: Self-pay | Admitting: Family Medicine

## 2024-07-04 ENCOUNTER — Encounter (HOSPITAL_BASED_OUTPATIENT_CLINIC_OR_DEPARTMENT_OTHER): Payer: Self-pay | Admitting: Family Medicine
